# Patient Record
Sex: Female | Born: 1950 | Race: White | Hispanic: No | Marital: Married | Smoking: Never smoker
Health system: Southern US, Community
[De-identification: ages and names within clinical notes are randomized; demographics above are authoritative.]

## PROBLEM LIST (undated history)

## (undated) DIAGNOSIS — Z87442 Personal history of urinary calculi: Secondary | ICD-10-CM

## (undated) DIAGNOSIS — N200 Calculus of kidney: Secondary | ICD-10-CM

## (undated) DIAGNOSIS — K219 Gastro-esophageal reflux disease without esophagitis: Secondary | ICD-10-CM

## (undated) DIAGNOSIS — R7303 Prediabetes: Secondary | ICD-10-CM

## (undated) DIAGNOSIS — R0982 Postnasal drip: Secondary | ICD-10-CM

## (undated) DIAGNOSIS — K449 Diaphragmatic hernia without obstruction or gangrene: Secondary | ICD-10-CM

## (undated) DIAGNOSIS — N201 Calculus of ureter: Secondary | ICD-10-CM

## (undated) DIAGNOSIS — D649 Anemia, unspecified: Secondary | ICD-10-CM

## (undated) DIAGNOSIS — I10 Essential (primary) hypertension: Secondary | ICD-10-CM

## (undated) DIAGNOSIS — Q625 Duplication of ureter: Secondary | ICD-10-CM

## (undated) DIAGNOSIS — J302 Other seasonal allergic rhinitis: Secondary | ICD-10-CM

## (undated) HISTORY — PX: OTHER SURGICAL HISTORY: SHX169

## (undated) HISTORY — PX: ABDOMINAL HYSTERECTOMY: SHX81

---

## 1983-09-26 HISTORY — PX: THYROID LOBECTOMY: SHX420

## 1999-06-22 ENCOUNTER — Other Ambulatory Visit: Admission: RE | Admit: 1999-06-22 | Discharge: 1999-06-22 | Payer: Self-pay | Admitting: *Deleted

## 1999-06-29 ENCOUNTER — Other Ambulatory Visit: Admission: RE | Admit: 1999-06-29 | Discharge: 1999-06-29 | Payer: Self-pay | Admitting: *Deleted

## 2000-06-12 ENCOUNTER — Other Ambulatory Visit: Admission: RE | Admit: 2000-06-12 | Discharge: 2000-06-12 | Payer: Self-pay | Admitting: *Deleted

## 2001-07-16 ENCOUNTER — Other Ambulatory Visit: Admission: RE | Admit: 2001-07-16 | Discharge: 2001-07-16 | Payer: Self-pay | Admitting: *Deleted

## 2001-07-21 ENCOUNTER — Encounter (HOSPITAL_COMMUNITY): Admission: RE | Admit: 2001-07-21 | Discharge: 2001-07-29 | Payer: Self-pay | Admitting: Family Medicine

## 2003-05-05 ENCOUNTER — Encounter: Admission: RE | Admit: 2003-05-05 | Discharge: 2003-05-05 | Payer: Self-pay | Admitting: Family Medicine

## 2003-05-05 ENCOUNTER — Encounter: Payer: Self-pay | Admitting: Family Medicine

## 2005-10-24 ENCOUNTER — Other Ambulatory Visit: Admission: RE | Admit: 2005-10-24 | Discharge: 2005-10-24 | Payer: Self-pay | Admitting: Family Medicine

## 2005-11-18 ENCOUNTER — Ambulatory Visit (HOSPITAL_COMMUNITY): Admission: RE | Admit: 2005-11-18 | Discharge: 2005-11-18 | Payer: Self-pay | Admitting: Obstetrics & Gynecology

## 2005-12-24 ENCOUNTER — Inpatient Hospital Stay (HOSPITAL_COMMUNITY): Admission: RE | Admit: 2005-12-24 | Discharge: 2005-12-26 | Payer: Self-pay | Admitting: Obstetrics & Gynecology

## 2009-09-30 HISTORY — PX: ABDOMINAL HYSTERECTOMY: SHX81

## 2014-06-15 ENCOUNTER — Other Ambulatory Visit: Payer: Self-pay | Admitting: Obstetrics & Gynecology

## 2014-06-15 DIAGNOSIS — R928 Other abnormal and inconclusive findings on diagnostic imaging of breast: Secondary | ICD-10-CM

## 2014-06-23 ENCOUNTER — Encounter (INDEPENDENT_AMBULATORY_CARE_PROVIDER_SITE_OTHER): Payer: Self-pay

## 2014-06-23 ENCOUNTER — Ambulatory Visit
Admission: RE | Admit: 2014-06-23 | Discharge: 2014-06-23 | Disposition: A | Discharge status: Home / Self Care | Payer: BC Managed Care – PPO | Source: Ambulatory Visit | Attending: Obstetrics & Gynecology | Admitting: Obstetrics & Gynecology

## 2014-06-23 DIAGNOSIS — R928 Other abnormal and inconclusive findings on diagnostic imaging of breast: Secondary | ICD-10-CM

## 2017-07-31 ENCOUNTER — Other Ambulatory Visit: Payer: Self-pay | Admitting: Obstetrics & Gynecology

## 2017-07-31 DIAGNOSIS — R928 Other abnormal and inconclusive findings on diagnostic imaging of breast: Secondary | ICD-10-CM

## 2017-08-07 ENCOUNTER — Ambulatory Visit
Admission: RE | Admit: 2017-08-07 | Discharge: 2017-08-07 | Disposition: A | Discharge status: Home / Self Care | Payer: BC Managed Care – PPO | Source: Ambulatory Visit | Attending: Obstetrics & Gynecology | Admitting: Obstetrics & Gynecology

## 2017-08-07 DIAGNOSIS — R928 Other abnormal and inconclusive findings on diagnostic imaging of breast: Secondary | ICD-10-CM

## 2017-08-07 IMAGING — MG 2D DIGITAL DIAGNOSTIC UNILATERAL LEFT MAMMOGRAM WITH CAD AND ADJ
3 series · 3 of 7 positions shown · non-contrast
Comparison: Previous exam(s).

CLINICAL DATA: Screening recall for possible left breast mass.

EXAM:
2D DIGITAL DIAGNOSTIC UNILATERAL LEFT MAMMOGRAM WITH CAD AND ADJUNCT
TOMO
LEFT BREAST ULTRASOUND

[L MLO]
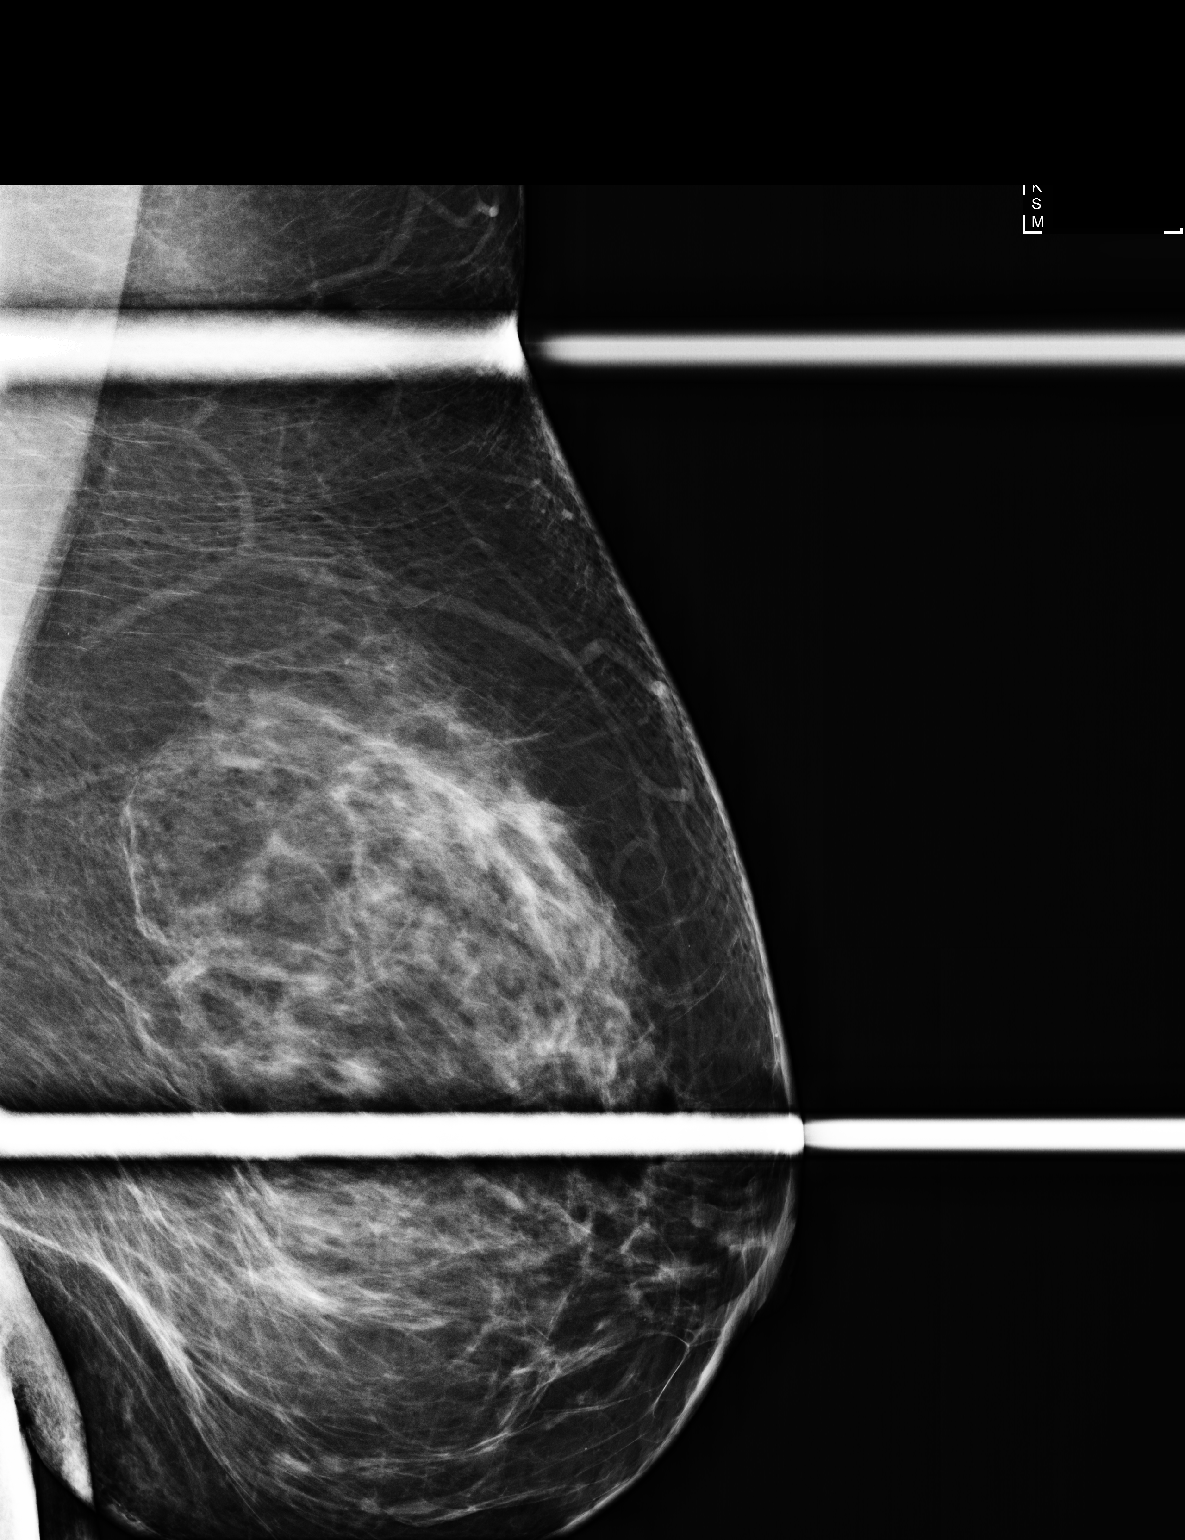

[L CC]
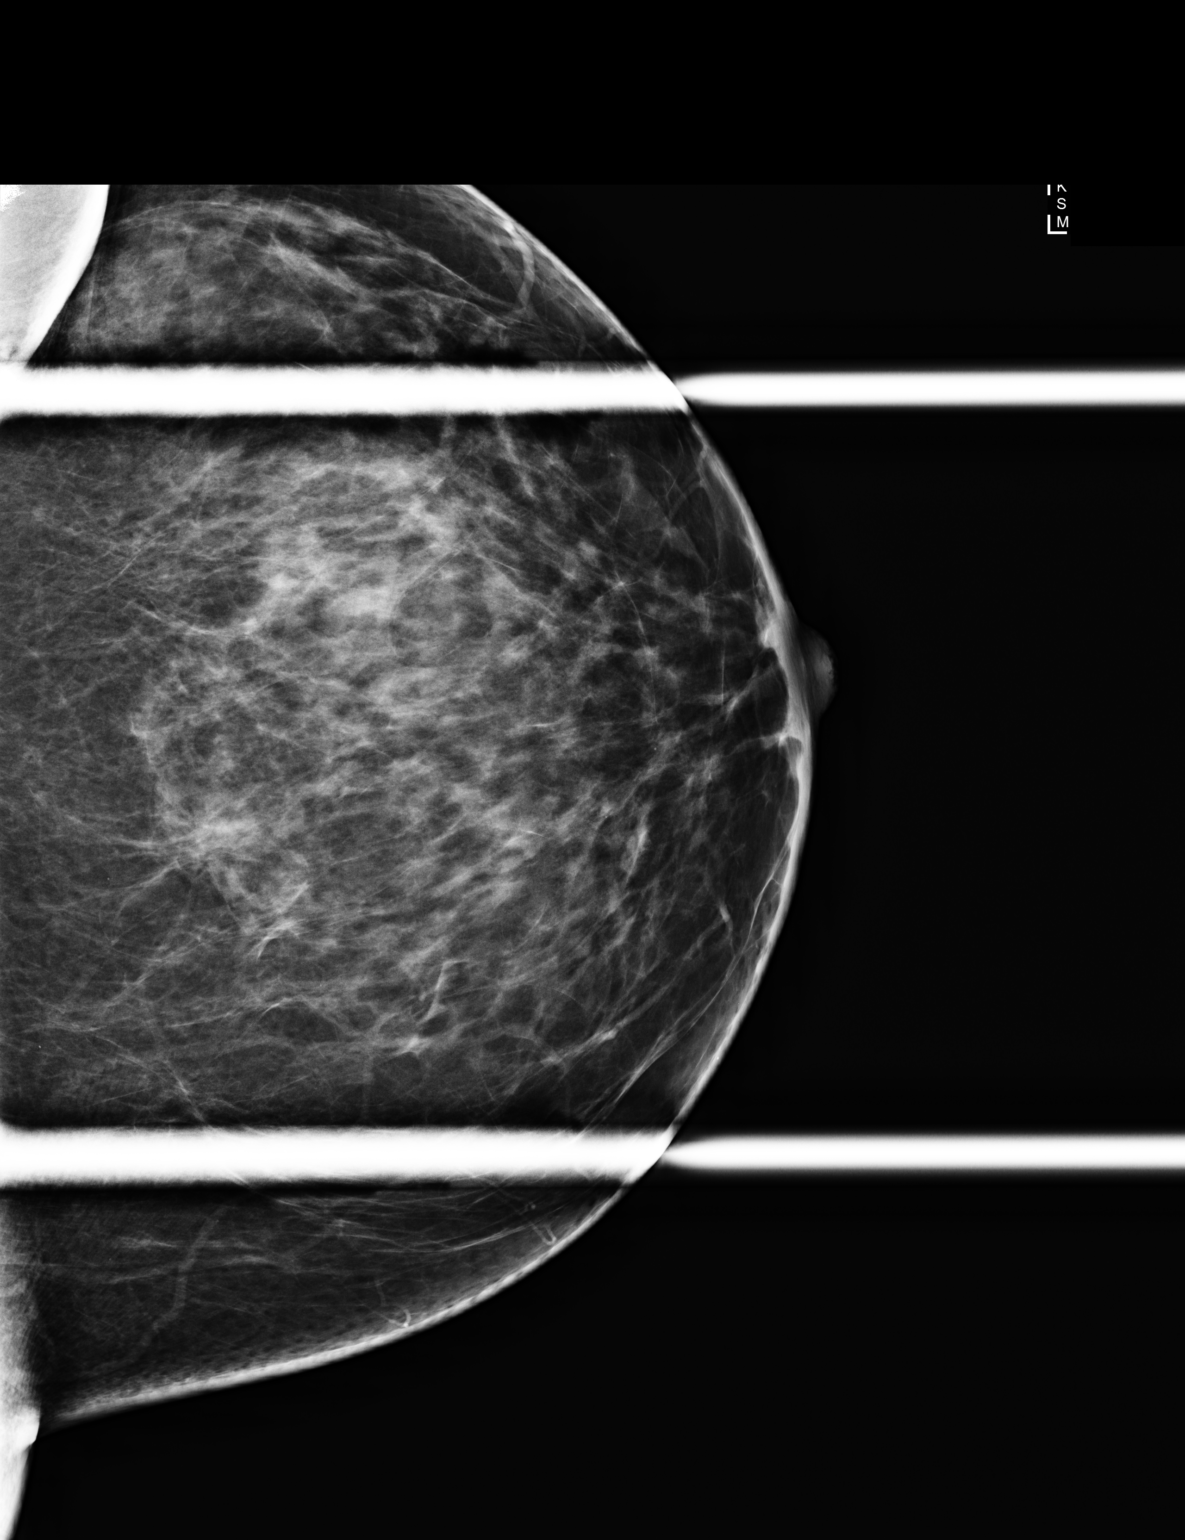

[L CC tomo · tomo slice 40/79.0]
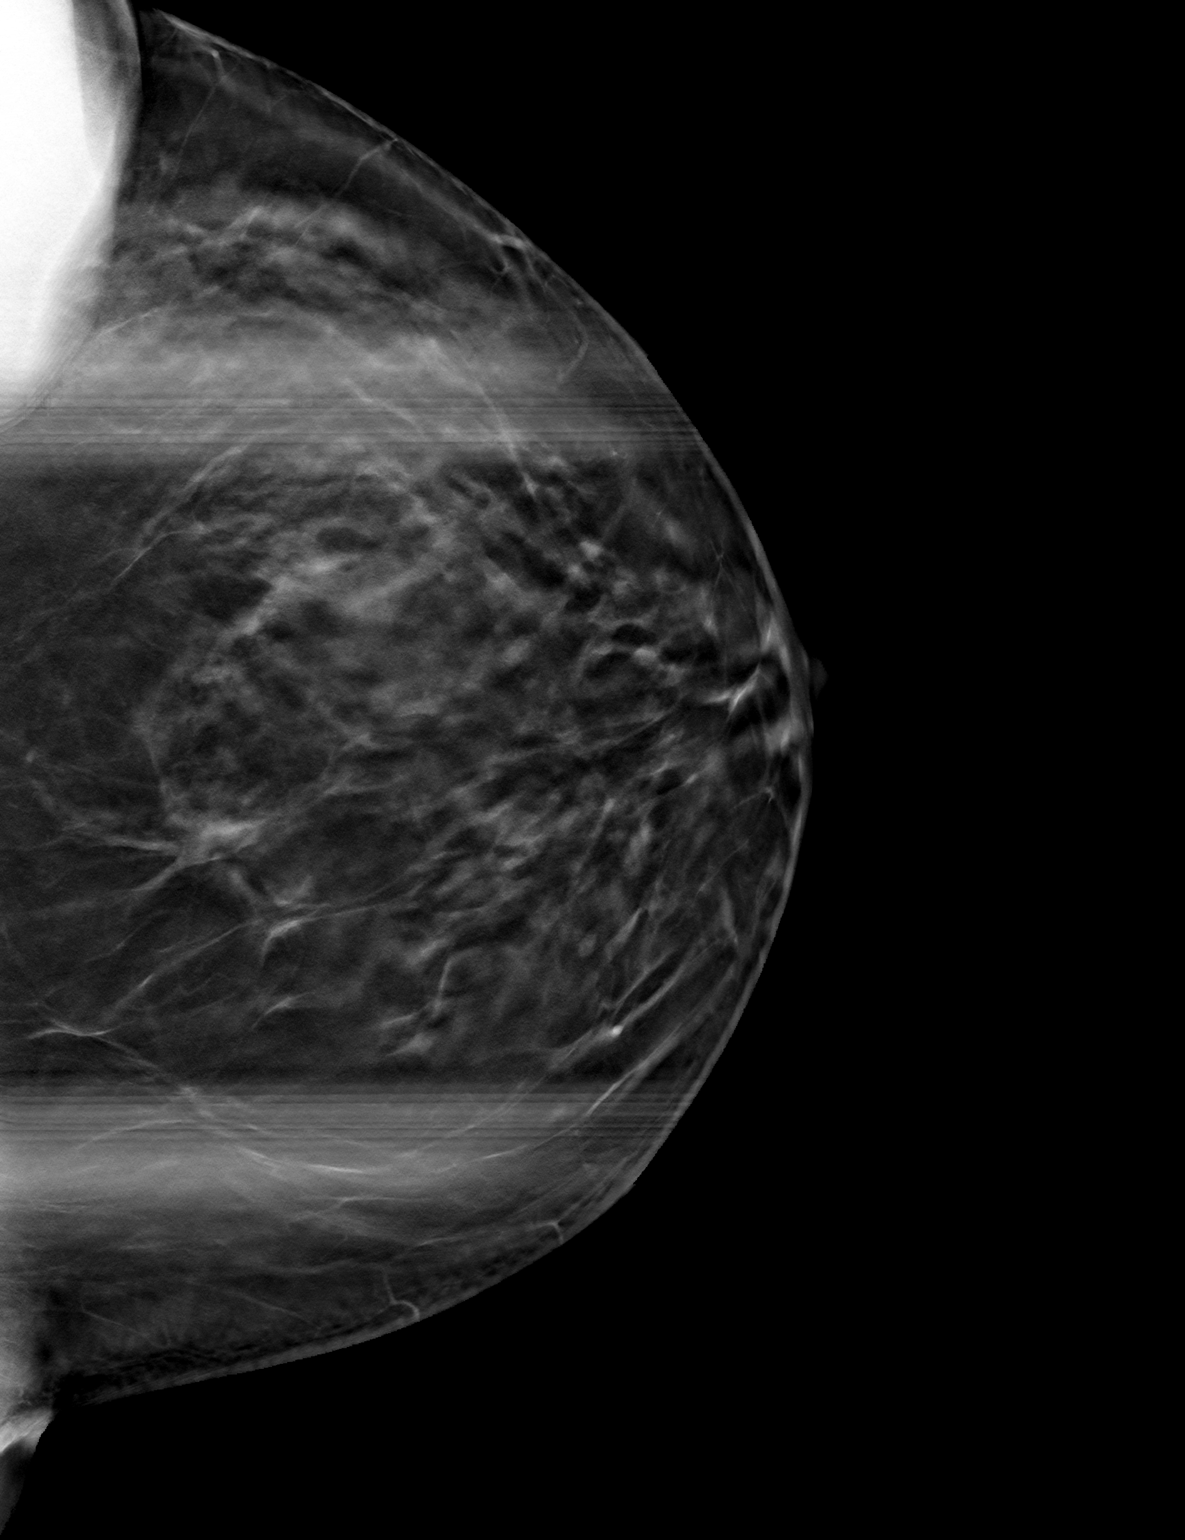

[3 of 7 positions shown; findings below may reference images not displayed]

ACR Breast Density Category c: The breast tissue is heterogeneously
dense, which may obscure small masses.
FINDINGS: Spot compression CC and MLO tomograms were performed of the left
breast. The initially questioned possible left breast mass appears
to resolve on the additional imaging with only heterogeneous
fibroglandular tissue seen in this location.

Mammographic images were processed with CAD.

Targeted ultrasound of the central to inner left breast was
performed. No discrete masses or abnormalities identified, only
heterogeneous fibroglandular tissue seen.
IMPRESSION: No findings of malignancy in the left breast.

RECOMMENDATION:
Recommend annual routine screening mammography, due [DATE].

I have discussed the findings and recommendations with the patient.
Results were also provided in writing at the conclusion of the
visit. If applicable, a reminder letter will be sent to the patient
regarding the next appointment.

BI-RADS CATEGORY  1: Negative.

## 2018-12-11 ENCOUNTER — Other Ambulatory Visit: Payer: Self-pay

## 2018-12-11 ENCOUNTER — Encounter (HOSPITAL_COMMUNITY): Payer: Self-pay | Admitting: *Deleted

## 2018-12-11 ENCOUNTER — Emergency Department (HOSPITAL_COMMUNITY): Payer: Medicare Other

## 2018-12-11 ENCOUNTER — Emergency Department (HOSPITAL_COMMUNITY)
Admission: EM | Admit: 2018-12-11 | Discharge: 2018-12-11 | Disposition: A | Discharge status: Home / Self Care | Payer: Medicare Other | Attending: Emergency Medicine | Admitting: Emergency Medicine

## 2018-12-11 DIAGNOSIS — N201 Calculus of ureter: Secondary | ICD-10-CM | POA: Diagnosis not present

## 2018-12-11 DIAGNOSIS — R1032 Left lower quadrant pain: Secondary | ICD-10-CM

## 2018-12-11 DIAGNOSIS — I10 Essential (primary) hypertension: Secondary | ICD-10-CM | POA: Insufficient documentation

## 2018-12-11 DIAGNOSIS — R109 Unspecified abdominal pain: Secondary | ICD-10-CM

## 2018-12-11 DIAGNOSIS — R10A2 Flank pain, left side: Secondary | ICD-10-CM

## 2018-12-11 DIAGNOSIS — R112 Nausea with vomiting, unspecified: Secondary | ICD-10-CM

## 2018-12-11 HISTORY — DX: Essential (primary) hypertension: I10

## 2018-12-11 HISTORY — DX: Calculus of kidney: N20.0

## 2018-12-11 LAB — CBC WITH DIFFERENTIAL/PLATELET
Abs Immature Granulocytes: 0.04 10*3/uL (ref 0.00–0.07)
BASOS ABS: 0 10*3/uL (ref 0.0–0.1)
Basophils Relative: 0 %
EOS PCT: 1 %
Eosinophils Absolute: 0.1 10*3/uL (ref 0.0–0.5)
HCT: 44.9 % (ref 36.0–46.0)
Hemoglobin: 15.1 g/dL — ABNORMAL HIGH (ref 12.0–15.0)
IMMATURE GRANULOCYTES: 0 %
LYMPHS PCT: 16 %
Lymphs Abs: 1.9 10*3/uL (ref 0.7–4.0)
MCH: 29.9 pg (ref 26.0–34.0)
MCHC: 33.6 g/dL (ref 30.0–36.0)
MCV: 88.9 fL (ref 80.0–100.0)
Monocytes Absolute: 0.7 10*3/uL (ref 0.1–1.0)
Monocytes Relative: 6 %
NEUTROS PCT: 77 %
Neutro Abs: 9.2 10*3/uL — ABNORMAL HIGH (ref 1.7–7.7)
Platelets: 319 10*3/uL (ref 150–400)
RBC: 5.05 MIL/uL (ref 3.87–5.11)
RDW: 12.5 % (ref 11.5–15.5)
WBC: 12 10*3/uL — ABNORMAL HIGH (ref 4.0–10.5)
nRBC: 0 % (ref 0.0–0.2)

## 2018-12-11 LAB — BASIC METABOLIC PANEL
ANION GAP: 9 (ref 5–15)
BUN: 17 mg/dL (ref 8–23)
CALCIUM: 11 mg/dL — AB (ref 8.9–10.3)
CO2: 24 mmol/L (ref 22–32)
Chloride: 107 mmol/L (ref 98–111)
Creatinine, Ser: 0.8 mg/dL (ref 0.44–1.00)
GFR calc non Af Amer: >60 mL/min (ref >60)
Glucose, Bld: 118 mg/dL — ABNORMAL HIGH (ref 70–99)
Potassium: 3.4 mmol/L — ABNORMAL LOW (ref 3.5–5.1)
Sodium: 140 mmol/L (ref 135–145)

## 2018-12-11 LAB — URINALYSIS, ROUTINE W REFLEX MICROSCOPIC
BILIRUBIN URINE: NEGATIVE
Glucose, UA: NEGATIVE mg/dL
KETONES UR: 20 mg/dL — AB
NITRITE: NEGATIVE
PH: 5 (ref 5.0–8.0)
Protein, ur: NEGATIVE mg/dL
RBC / HPF: >50 RBC/hpf — ABNORMAL HIGH (ref 0–5)
Specific Gravity, Urine: 1.014 (ref 1.005–1.030)

## 2018-12-11 MED ORDER — ONDANSETRON 4 MG PO TBDP: 4.0000 mg | ORAL_TABLET | Freq: Three times a day (TID) | ORAL | 0 refills | Status: DC | PRN

## 2018-12-11 MED ORDER — KETOROLAC TROMETHAMINE 30 MG/ML IJ SOLN
15.0000 mg | Freq: Once | INTRAMUSCULAR | Status: AC
  Administered 2018-12-11: 15 mg via INTRAVENOUS
  Filled 2018-12-11: qty 1

## 2018-12-11 MED ORDER — HYDROCODONE-ACETAMINOPHEN 5-325 MG PO TABS: 1.0000 | ORAL_TABLET | Freq: Four times a day (QID) | ORAL | 0 refills | Status: DC | PRN

## 2018-12-11 MED ORDER — NAPROXEN 500 MG PO TABS: 500.0000 mg | ORAL_TABLET | Freq: Two times a day (BID) | ORAL | 0 refills | Status: DC | PRN

## 2018-12-11 MED ORDER — TAMSULOSIN HCL 0.4 MG PO CAPS: 0.4000 mg | ORAL_CAPSULE | Freq: Every day | ORAL | 0 refills | Status: DC

## 2018-12-11 MED ORDER — ONDANSETRON HCL 4 MG/2ML IJ SOLN
4.0000 mg | Freq: Once | INTRAMUSCULAR | Status: AC
Start: 2018-12-11 — End: 2018-12-11
  Administered 2018-12-11: 4 mg via INTRAVENOUS
  Filled 2018-12-11: qty 2

## 2018-12-11 NOTE — ED Triage Notes (Signed)
Pt was instructed to come to the ED from [facility] walk-in clinic today.  She reports L flank pain x 2 days ago, started to radiate to her L pelvic area yesterday with dry heaving.  Vomited x 1 today.  She also endorses urinary frequency and some mild discomfort when urinating.  She was found to have hematuria at the clinic today and was told she needed CT to r/o kidney stones.

## 2018-12-11 NOTE — Discharge Instructions (Signed)
Take naprosyn as directed as needed for pain using norco for breakthrough pain. Do not drive or operate machinery with pain medication use. May need over-the-counter stool softener with this pain medication use. Use Zofran as needed for nausea. Use Flomax as directed, as this medication will help you pass the stone. Strain all urine to try to catch the stone when it passes. Follow-up with the urologist in the next 1 to 2 weeks for recheck of ongoing pain, however for intractable or uncontrollable symptoms at home then return to the [facility] emergency department.  °  °

## 2018-12-11 NOTE — ED Provider Notes (Signed)
[facility] DEPT Provider Note   CSN: [id number] Arrival date & time: 12/11/18  1404    History   Chief Complaint Chief Complaint  Patient presents with   Hematuria    HPI    Cynthia Dyer is a 68 y.o. female with a PMHx of HTN and kidney stones, and PSHx of abdominal hysterectomy, who presents to the ED with complaints of possible kidney stone.  Patient states that yesterday she started having left flank pain which was radiating to the LLQ, felt similar to prior kidney stones.  The pain has come and gone, but today got severe so she went to an urgent care who tested her urine sample and said that she had hematuria and they felt that she needed to be evaluated in the emergency room to get a CT to see if she had a kidney stone.  She states that the pain was initially in her left flank radiating to the LLQ but now has become more focalized in the LLQ.  She describes the pain as 8/10 sharp intermittent pain worse with movement and with no treatments tried prior to arrival.  She reports associated increased urinary frequency and urgency, darker urine, nausea, and one episode of nonbloody nonbilious emesis today.  She denies any recent travel, sick contacts, suspicious food intake, frequent NSAID use, or recent alcohol use.  She denies any fevers, chills, chest pain, shortness of breath, hematemesis, diarrhea, constipation, melena, hematochezia, dysuria, malodorous urine, vaginal bleeding or discharge, myalgias, arthralgias, numbness, tingling, focal weakness, or any other complaints at this time.  She states this feels like prior kidney stones.  The history is provided by the patient and medical records. No language interpreter was used.  Hematuria  Associated symptoms include abdominal pain. Pertinent negatives include no chest pain and no shortness of breath.    Past Medical History:  Diagnosis Date   Hypertension    Kidney stone     There are no active  problems to display for this patient.   Past Surgical History:  Procedure Laterality Date   ABDOMINAL HYSTERECTOMY       OB History   No obstetric history on file.      Home Medications    Prior to Admission medications   Not on File    Family History No family history on file.  Social History Social History   Tobacco Use   Smoking status: Never Smoker   Smokeless tobacco: Never Used  Substance Use Topics   Alcohol use: Yes    Comment: occa   Drug use: Never     Allergies   Penicillins and Percocet [oxycodone-acetaminophen]   Review of Systems Review of Systems  Constitutional: Negative for chills and fever.  Respiratory: Negative for shortness of breath.   Cardiovascular: Negative for chest pain.  Gastrointestinal: Positive for abdominal pain, nausea and vomiting. Negative for blood in stool, constipation and diarrhea.  Genitourinary: Positive for flank pain, frequency, hematuria (darker urine, told at [facility] that +hematuria) and urgency. Negative for dysuria, vaginal bleeding and vaginal discharge.       No malodorous urine  Musculoskeletal: Negative for arthralgias and myalgias.  Skin: Negative for color change.  Allergic/Immunologic: Negative for immunocompromised state.  Neurological: Negative for weakness and numbness.  Psychiatric/Behavioral: Negative for confusion.   All other systems reviewed and are negative for acute change except as noted in the HPI.    Physical Exam Updated Vital Signs BP (!) 174/98 (BP Location: Left Arm)  Pulse 78    Temp 98.5 F (36.9 C) (Oral)    Resp 16    SpO2 100%   Physical Exam Vitals signs and nursing note reviewed.  Constitutional:      General: She is not in acute distress.    Appearance: Normal appearance. She is well-developed. She is not toxic-appearing.     Comments: Afebrile, nontoxic, NAD  HENT:     Head: Normocephalic and atraumatic.  Eyes:     General:        Right eye: No discharge.         Left eye: No discharge.     Conjunctiva/sclera: Conjunctivae normal.  Neck:     Musculoskeletal: Normal range of motion and neck supple.  Cardiovascular:     Rate and Rhythm: Normal rate and regular rhythm.     Pulses: Normal pulses.     Heart sounds: Normal heart sounds, S1 normal and S2 normal. No murmur. No friction rub. No gallop.   Pulmonary:     Effort: Pulmonary effort is normal. No respiratory distress.     Breath sounds: Normal breath sounds. No decreased breath sounds, wheezing, rhonchi or rales.  Abdominal:     General: Bowel sounds are normal. There is no distension.     Palpations: Abdomen is soft. Abdomen is not rigid.     Tenderness: There is abdominal tenderness in the left lower quadrant. There is no right CVA tenderness, left CVA tenderness, guarding or rebound. Negative signs include Murphy's sign and McBurney's sign.       Comments: Soft, nondistended, +BS throughout, with mild LLQ TTP, no r/g/r, neg murphy's, neg mcburney's, no CVA TTP   Musculoskeletal: Normal range of motion.  Skin:    General: Skin is warm and dry.     Findings: No rash.  Neurological:     Mental Status: She is alert and oriented to person, place, and time.     Sensory: Sensation is intact. No sensory deficit.     Motor: Motor function is intact.  Psychiatric:        Mood and Affect: Mood and affect normal.        Behavior: Behavior normal.      ED Treatments / Results  Labs (all labs ordered are listed, but only abnormal results are displayed) Labs Reviewed  URINALYSIS, ROUTINE W REFLEX MICROSCOPIC - Abnormal; Notable for the following components:      Result Value   APPearance CLOUDY (*)    Hgb urine dipstick LARGE (*)    Ketones, ur 20 (*)    Leukocytes,Ua MODERATE (*)    RBC / HPF >50 (*)    Bacteria, UA RARE (*)    All other components within normal limits  CBC WITH DIFFERENTIAL/PLATELET - Abnormal; Notable for the following components:   WBC 12.0 (*)    Hemoglobin 15.1 (*)     Neutro Abs 9.2 (*)    All other components within normal limits  BASIC METABOLIC PANEL - Abnormal; Notable for the following components:   Potassium 3.4 (*)    Glucose, Bld 118 (*)    Calcium 11.0 (*)    All other components within normal limits    EKG None  Radiology Ct Renal Stone Study  Result Date: 12/11/2018 CLINICAL DATA:  68 year old female with left flank pain, microscopic hematuria. Symptoms beginning 2000 hours yesterday with nausea vomiting. EXAM: CT ABDOMEN AND PELVIS WITHOUT CONTRAST TECHNIQUE: Multidetector CT imaging of the abdomen and pelvis was performed following the  standard protocol without IV contrast. COMPARISON:  None. FINDINGS: Lower chest: Negative aside from small gastric hiatal hernia. Hepatobiliary: Negative noncontrast liver and gallbladder. Pancreas: Negative. Spleen: Negative. Adrenals/Urinary Tract: Normal adrenal glands. Negative noncontrast right kidney and right ureter. There is only mild left hydronephrosis, but there is up to moderate left hydroureter with periureteral stranding continuing to the left ureterovesical junction. 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 At the left UVJ there is a 5 by 10 millimeter obstructing calculus (series 2, image 62 and coronal image 94. The urinary bladder is otherwise diminutive and unremarkable. No other urinary calculus identified. Stomach/Bowel: Moderate diverticulosis from the hepatic flexure through the sigmoid colon. No definite active inflammation. Negative right colon and appendix. Negative terminal ileum. No dilated small bowel. Negative stomach aside from small hiatal hernia. No free air, free fluid. Vascular/Lymphatic: Vascular patency is not evaluated in the absence of IV contrast. No lymphadenopathy. Reproductive: Surgically absent uterus. Diminutive or absent ovaries. Other: No pelvic free fluid. Musculoskeletal: Severe lower lumbar facet arthropathy.  Bilateral vacuum SI joint phenomena is degenerative. No acute osseous abnormality identified. IMPRESSION: 1. Acute obstructive uropathy on the left with a 5 x 10 mm calculus at the left UVJ. 2. 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 No other urologic calculus identified. 3. Diverticulosis of the colon. Small gastric hiatal hernia. Electronically Signed   By: Genevie Ann M.D.   On: 12/11/2018 18:23    Procedures Procedures (including critical care time)  Medications Ordered in ED Medications  ondansetron (ZOFRAN) injection 4 mg (4 mg Intravenous Given 12/11/18 1821)  ketorolac (TORADOL) 30 MG/ML injection 15 mg (15 mg Intravenous Given 12/11/18 1822)     Initial Impression / Assessment and Plan / ED Course  I have reviewed the triage vital signs and the nursing notes.  Pertinent labs & imaging results that were available during my care of the patient were reviewed by me and considered in my medical decision making (see chart for details).        68 y.o. female here with left flank pain that began yesterday and has now radiated to the LLQ, feels like prior kidney stones.  Has had some nausea and vomiting as well as darker urine and urinary frequency and urgency.  Was seen at an urgent care who told her that she had a lot of blood in her urine and they wanted her to have a CT scan done to evaluate for kidney stone so they sent her here.  On exam, no left flank tenderness, mild LLQ TTP, non-peritoneal.  Afebrile and nontoxic-appearing.  Urinalysis with large hemoglobin, moderate leuks but no nitrates, greater than 50 RBCs, 21-50 WBCs but rare bacteria.  Likely kidney stone.  Will obtain basic lab work to check kidney function and get CT renal study.  Will give Toradol and Zofran and reassess shortly. Discussed case with my attending Dr. Ronnald Nian who agrees with plan.   7:41 PM CBC w/diff with mildly elevated WBC 12.0 but otherwise unremarkable. BMP fairly  unremarkable. CT renal showing 5x58mm calculus at the L UVJ and incidentally noted duplicated left renal collecting system and proximal ureter but no other urologic calculus identified, also incidentally noted diverticulosis and small gastric hiatal hernia. Pt feeling better. Doubt need for further emergent work up, doubt need for emergent consultation with urology as this kidney stone should pass given that it's 2mm in width on imaging. Will send home with zofran, naprosyn, norco,  and flomax. Urine strainer given. Advised staying hydrated. F/up with urology in 1-2wks but strict return precautions advised. I explained the diagnosis and have given explicit precautions to return to the ER including for any other new or worsening symptoms. The patient understands and accepts the medical plan as it's been dictated and I have answered their questions. Discharge instructions concerning home care and prescriptions have been given. The patient is STABLE and is discharged to home in good condition.    Final Clinical Impressions(s) / ED Diagnoses   Final diagnoses:  Left flank pain  Ureterolithiasis  Nausea and vomiting in adult patient  Left lower quadrant abdominal pain    ED Discharge Orders         Ordered    naproxen (NAPROSYN) 500 MG tablet  2 times daily PRN     12/11/18 1941    HYDROcodone-acetaminophen (NORCO) 5-325 MG tablet  Every 6 hours PRN     12/11/18 1941    tamsulosin (FLOMAX) 0.4 MG CAPS capsule  Daily after supper     12/11/18 1941    ondansetron (ZOFRAN ODT) 4 MG disintegrating tablet  Every 8 hours PRN     12/11/18 [address] [place], PA-C 12/11/18 [place], [place], DO 12/12/18 [phone]

## 2018-12-17 ENCOUNTER — Other Ambulatory Visit: Payer: Self-pay | Admitting: Urology

## 2018-12-21 ENCOUNTER — Encounter (HOSPITAL_COMMUNITY): Payer: Self-pay | Admitting: *Deleted

## 2018-12-24 ENCOUNTER — Ambulatory Visit (HOSPITAL_COMMUNITY)
Admission: RE | Admit: 2018-12-24 | Discharge: 2018-12-24 | Disposition: A | Discharge status: Home / Self Care | Payer: Medicare Other | Attending: Urology | Admitting: Urology

## 2018-12-24 ENCOUNTER — Encounter (HOSPITAL_COMMUNITY): Payer: Self-pay | Admitting: General Practice

## 2018-12-24 ENCOUNTER — Ambulatory Visit (HOSPITAL_COMMUNITY): Payer: Medicare Other

## 2018-12-24 ENCOUNTER — Encounter (HOSPITAL_COMMUNITY)
Admission: RE | Disposition: A | Discharge status: Home / Self Care | Payer: Self-pay | Source: Home / Self Care | Attending: Urology

## 2018-12-24 DIAGNOSIS — N201 Calculus of ureter: Secondary | ICD-10-CM | POA: Diagnosis present

## 2018-12-24 DIAGNOSIS — Z885 Allergy status to narcotic agent status: Secondary | ICD-10-CM | POA: Insufficient documentation

## 2018-12-24 DIAGNOSIS — Z87442 Personal history of urinary calculi: Secondary | ICD-10-CM | POA: Insufficient documentation

## 2018-12-24 DIAGNOSIS — Z880 Allergy status to penicillin: Secondary | ICD-10-CM | POA: Insufficient documentation

## 2018-12-24 DIAGNOSIS — I10 Essential (primary) hypertension: Secondary | ICD-10-CM | POA: Insufficient documentation

## 2018-12-24 DIAGNOSIS — N139 Obstructive and reflux uropathy, unspecified: Secondary | ICD-10-CM | POA: Diagnosis not present

## 2018-12-24 HISTORY — DX: Personal history of urinary calculi: Z87.442

## 2018-12-24 HISTORY — PX: EXTRACORPOREAL SHOCK WAVE LITHOTRIPSY: SHX1557

## 2018-12-24 SURGERY — LITHOTRIPSY, ESWL
Anesthesia: LOCAL | Laterality: Left

## 2018-12-24 MED ORDER — DIPHENHYDRAMINE HCL 25 MG PO CAPS
25.0000 mg | ORAL_CAPSULE | ORAL | Status: AC
  Administered 2018-12-24: 25 mg via ORAL
  Filled 2018-12-24: qty 1

## 2018-12-24 MED ORDER — DIAZEPAM 5 MG PO TABS
10.0000 mg | ORAL_TABLET | ORAL | Status: AC
  Administered 2018-12-24: 10 mg via ORAL
  Filled 2018-12-24: qty 2

## 2018-12-24 MED ORDER — SODIUM CHLORIDE 0.9 % IV SOLN
INTRAVENOUS | Status: DC
  Administered 2018-12-24: 07:00:00 via INTRAVENOUS

## 2018-12-24 MED ORDER — TAMSULOSIN HCL 0.4 MG PO CAPS: 0.4000 mg | ORAL_CAPSULE | Freq: Every day | ORAL | 1 refills | Status: DC

## 2018-12-24 MED ORDER — CIPROFLOXACIN HCL 500 MG PO TABS
500.0000 mg | ORAL_TABLET | ORAL | Status: AC
  Administered 2018-12-24: 500 mg via ORAL
  Filled 2018-12-24: qty 1

## 2018-12-24 NOTE — H&P (Signed)
CC: flank pain   HPI: Cynthia Dyer is a 68 year old female with a one week history of left LLQ pain secondary to a 5 x 10 mm left UVJ stone.   Hx of stones--passed one >25 years ago.   Today, she reports intermittent left sided flank and LLQ pain that she describes as dull in nature. She is currently on naproxen, flomax which partially alleviates her pain. Denies N/V/F/C. AFVSS today.   CTSS 12/11/2018  IMPRESSION:  1. Acute obstructive uropathy on the left with a 5 x 10 mm calculus  at the left UVJ.  2. 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 No other urologic calculus identified.  3. Diverticulosis of the colon. Small gastric hiatal hernia.     ALLERGIES: Fiorinal CAPS penicillin Percocet    MEDICATIONS: Bisoprolol-Hydrochlorothiazide  Flonase Allergy Relief     GU PSH: Hysterectomy - 2011    NON-GU PSH: Thyroid Surgery - 1984    GU PMH: None   NON-GU PMH: Gout Hypertension Hypothyroidism    FAMILY HISTORY: Death In The Family Father - Father Enlarged prostate - Father Kidney Stones - Father   SOCIAL HISTORY: Marital Status: Married Preferred Language: English; Ethnicity: Not Hispanic Or Latino; Race: White Current Smoking Status: Patient has never smoked.   Tobacco Use Assessment Completed: Used Tobacco in last 30 days? Social Drinker.  Drinks 2 caffeinated drinks per day.    REVIEW OF SYSTEMS:    GU Review Female:   Patient reports frequent urination, hard to postpone urination, burning /pain with urination, and get up at night to urinate. Patient denies leakage of urine, stream starts and stops, trouble starting your stream, have to strain to urinate, and being pregnant.  Gastrointestinal (Upper):   Patient reports nausea. Patient denies vomiting and indigestion/ heartburn.  Gastrointestinal (Lower):   Patient denies constipation and diarrhea.  Constitutional:   Patient reports weight loss. Patient denies fever, night  sweats, and fatigue.  Skin:   Patient denies skin rash/ lesion and itching.  Eyes:   Patient denies blurred vision and double vision.  Ears/ Nose/ Throat:   Patient reports sinus problems. Patient denies sore throat.  Hematologic/Lymphatic:   Patient denies swollen glands and easy bruising.  Cardiovascular:   Patient denies leg swelling and chest pains.  Respiratory:   Patient denies cough and shortness of breath.  Endocrine:   Patient denies excessive thirst.  Musculoskeletal:   Patient denies back pain and joint pain.  Neurological:   Patient reports headaches. Patient denies dizziness.  Psychologic:   Patient denies depression and anxiety.   VITAL SIGNS:      12/17/2018 09:38 AM  Weight 173 lb / 78.47 kg  Height 62 in / 157.48 cm  BP 156/91 mmHg  Pulse 71 /min  Temperature 98.3 F / 36.8 C  BMI 31.6 kg/m   MULTI-SYSTEM PHYSICAL EXAMINATION:    Constitutional: Well-nourished. No physical deformities. Normally developed. Good grooming.  Neck: Neck symmetrical, not swollen. Normal tracheal position.  Respiratory: No labored breathing, no use of accessory muscles.   Cardiovascular: Normal temperature, normal extremity pulses, no swelling, no varicosities.  Lymphatic: No enlargement of neck, axillae, groin.  Skin: No paleness, no jaundice, no cyanosis. No lesion, no ulcer, no rash.  Neurologic / Psychiatric: Oriented to time, oriented to place, oriented to person. No depression, no anxiety, no agitation.  Gastrointestinal: No mass, no tenderness, no rigidity, non obese abdomen.  Eyes: Normal conjunctivae. Normal eyelids.  Ears, Nose, Mouth, and  Throat: Left ear no scars, no lesions, no masses. Right ear no scars, no lesions, no masses. Nose no scars, no lesions, no masses. Normal hearing. Normal lips.  Musculoskeletal: Normal gait and station of head and neck.     PAST DATA REVIEWED:  Source Of History:  Patient   PROCEDURES:         KUB - [id number]  A single view of the abdomen is  obtained.      Patient confirmed No Neulasta OnPro Device.   There is a persistent calcification in the expect course of the distal left ureter. No urinary calculi seen within the upper urinary tract on the right. No calcifications noted within bladder. No boney abnormalities. No abnormal bowel/gas patterns or signs of free air.          Urinalysis w/Scope Dipstick Dipstick Cont'd Micro  Color: Straw Bilirubin: Neg mg/dL WBC/hpf: 6 - 10/hpf  Appearance: Cloudy Ketones: Neg mg/dL RBC/hpf: 0 - 2/hpf  Specific Gravity: 1.010 Blood: 2+ ery/uL Bacteria: NS (Not Seen)  pH: <=5.0 Protein: Neg mg/dL Cystals: NS (Not Seen)  Glucose: Neg mg/dL Urobilinogen: 0.2 mg/dL Casts: NS (Not Seen)    Nitrites: Neg Trichomonas: Not Present    Leukocyte Esterase: 3+ leu/uL Mucous: Not Present      Epithelial Cells: 0 - 5/hpf      Yeast: NS (Not Seen)      Sperm: Not Present    ASSESSMENT:      ICD-10 Details  1 GU:   Ureteral calculus - N20.1 Left, 5x 10 mm left UVJ stone  2   History of urolithiasis - Z87.442    PLAN:           Orders X-Rays: KUB          Schedule Return Visit/Planned Activity: ASAP - Schedule Surgery          Document Letter(s):  Created for Patient: Clinical Summary         Notes:   -The risks, benefits and alternatives of LEFT ESWL was discussed with the patient. I described the risks which include arrhythmia, kidney contusion, kidney hemorrhage, need for transfusion, back discomfort, flank ecchymosis, flank abrasion, inability to break up stone, inability to pass stone fragments, Steinstrasse, infection associated with obstructing stones, need for different surgical procedure and possible need for repeat shockwave lithotripsy. The patient voices understanding and wishes to proceed.   We discussed criteria to return to clinic or proceed to the ER which include: Fever/chills, worsening pain, nausea/vomiting and/or persistent gross hematuria.

## 2018-12-24 NOTE — Op Note (Signed)
[facility] Operative Note  Treating Physician: Ellison Hughs, MD  Pre-op diagnosis: 1.0 cm left UVJ stone  Post-op diagnosis: Same   Procedure: LEFT ESWL  See Aris Everts OP note scanned into chart. Also because of the size, density, location and other factors that cannot be anticipated I feel this will likely be a staged procedure. This fact supersedes any indication in the scanned [place] stone operative note to the contrary

## 2018-12-25 ENCOUNTER — Encounter (HOSPITAL_COMMUNITY): Payer: Self-pay | Admitting: Urology

## 2019-04-16 ENCOUNTER — Other Ambulatory Visit: Payer: Self-pay | Admitting: Urology

## 2019-04-20 ENCOUNTER — Other Ambulatory Visit (HOSPITAL_COMMUNITY)
Admission: RE | Admit: 2019-04-20 | Discharge: 2019-04-20 | Disposition: A | Discharge status: Home / Self Care | Payer: Medicare Other | Source: Ambulatory Visit | Attending: Urology | Admitting: Urology

## 2019-04-20 DIAGNOSIS — Z1159 Encounter for screening for other viral diseases: Secondary | ICD-10-CM | POA: Insufficient documentation

## 2019-04-20 LAB — SARS CORONAVIRUS 2 (TAT 6-24 HRS): SARS Coronavirus 2: NEGATIVE

## 2019-04-21 ENCOUNTER — Encounter (HOSPITAL_BASED_OUTPATIENT_CLINIC_OR_DEPARTMENT_OTHER): Payer: Self-pay | Admitting: *Deleted

## 2019-04-21 ENCOUNTER — Other Ambulatory Visit: Payer: Self-pay

## 2019-04-21 NOTE — Progress Notes (Signed)
Spoke w/ pt via phone for pre-op interview.  Npo after mn w/ exception clear liquids until 0730 then nothing by mouth, pt verbalized understanding.  Arrive at 1145.  Needs istat 8 and ekg.  Pt had covid test done 04-20-2019.

## 2019-04-23 ENCOUNTER — Ambulatory Visit (HOSPITAL_BASED_OUTPATIENT_CLINIC_OR_DEPARTMENT_OTHER): Payer: Medicare Other | Admitting: Certified Registered"

## 2019-04-23 ENCOUNTER — Other Ambulatory Visit: Payer: Self-pay

## 2019-04-23 ENCOUNTER — Encounter (HOSPITAL_BASED_OUTPATIENT_CLINIC_OR_DEPARTMENT_OTHER)
Admission: RE | Disposition: A | Discharge status: Home / Self Care | Payer: Self-pay | Source: Home / Self Care | Attending: Urology

## 2019-04-23 ENCOUNTER — Ambulatory Visit (HOSPITAL_BASED_OUTPATIENT_CLINIC_OR_DEPARTMENT_OTHER)
Admission: RE | Admit: 2019-04-23 | Discharge: 2019-04-23 | Disposition: A | Discharge status: Home / Self Care | Payer: Medicare Other | Attending: Urology | Admitting: Urology

## 2019-04-23 ENCOUNTER — Encounter (HOSPITAL_BASED_OUTPATIENT_CLINIC_OR_DEPARTMENT_OTHER): Payer: Self-pay | Admitting: *Deleted

## 2019-04-23 DIAGNOSIS — I10 Essential (primary) hypertension: Secondary | ICD-10-CM | POA: Diagnosis not present

## 2019-04-23 DIAGNOSIS — E890 Postprocedural hypothyroidism: Secondary | ICD-10-CM | POA: Diagnosis not present

## 2019-04-23 DIAGNOSIS — Q625 Duplication of ureter: Secondary | ICD-10-CM | POA: Diagnosis not present

## 2019-04-23 DIAGNOSIS — K449 Diaphragmatic hernia without obstruction or gangrene: Secondary | ICD-10-CM | POA: Insufficient documentation

## 2019-04-23 DIAGNOSIS — N201 Calculus of ureter: Secondary | ICD-10-CM | POA: Diagnosis not present

## 2019-04-23 DIAGNOSIS — Z87442 Personal history of urinary calculi: Secondary | ICD-10-CM | POA: Diagnosis not present

## 2019-04-23 HISTORY — DX: Postnasal drip: R09.82

## 2019-04-23 HISTORY — DX: Duplication of ureter: Q62.5

## 2019-04-23 HISTORY — DX: Gastro-esophageal reflux disease without esophagitis: K21.9

## 2019-04-23 HISTORY — PX: CYSTOSCOPY/URETEROSCOPY/HOLMIUM LASER/STENT PLACEMENT: SHX6546

## 2019-04-23 HISTORY — DX: Diaphragmatic hernia without obstruction or gangrene: K44.9

## 2019-04-23 HISTORY — DX: Calculus of ureter: N20.1

## 2019-04-23 HISTORY — DX: Other seasonal allergic rhinitis: J30.2

## 2019-04-23 LAB — POCT I-STAT, CHEM 8
BUN: 14 mg/dL (ref 8–23)
Calcium, Ion: 1.46 mmol/L — ABNORMAL HIGH (ref 1.15–1.40)
Chloride: 107 mmol/L (ref 98–111)
Creatinine, Ser: 0.7 mg/dL (ref 0.44–1.00)
Glucose, Bld: 103 mg/dL — ABNORMAL HIGH (ref 70–99)
HCT: 43 % (ref 36.0–46.0)
Hemoglobin: 14.6 g/dL (ref 12.0–15.0)
Potassium: 4 mmol/L (ref 3.5–5.1)
Sodium: 142 mmol/L (ref 135–145)
TCO2: 22 mmol/L (ref 22–32)

## 2019-04-23 SURGERY — CYSTOSCOPY/URETEROSCOPY/HOLMIUM LASER/STENT PLACEMENT
Anesthesia: General | Site: Ureter | Laterality: Left

## 2019-04-23 MED ORDER — ONDANSETRON HCL 4 MG/2ML IJ SOLN
4.0000 mg | Freq: Once | INTRAMUSCULAR | Status: DC | PRN
  Filled 2019-04-23: qty 2

## 2019-04-23 MED ORDER — ONDANSETRON HCL 4 MG/2ML IJ SOLN
INTRAMUSCULAR | Status: AC
  Filled 2019-04-23: qty 2

## 2019-04-23 MED ORDER — LIDOCAINE 2% (20 MG/ML) 5 ML SYRINGE
INTRAMUSCULAR | Status: DC | PRN
  Administered 2019-04-23: 100 mg via INTRAVENOUS

## 2019-04-23 MED ORDER — DEXAMETHASONE SODIUM PHOSPHATE 10 MG/ML IJ SOLN
INTRAMUSCULAR | Status: AC
  Filled 2019-04-23: qty 1

## 2019-04-23 MED ORDER — MEPERIDINE HCL 25 MG/ML IJ SOLN
6.2500 mg | INTRAMUSCULAR | Status: DC | PRN
  Filled 2019-04-23: qty 1

## 2019-04-23 MED ORDER — PHENAZOPYRIDINE HCL 200 MG PO TABS: 200.0000 mg | ORAL_TABLET | Freq: Three times a day (TID) | ORAL | 0 refills | Status: AC | PRN

## 2019-04-23 MED ORDER — KETOROLAC TROMETHAMINE 15 MG/ML IJ SOLN
INTRAMUSCULAR | Status: DC | PRN
  Administered 2019-04-23: 15 mg via INTRAVENOUS

## 2019-04-23 MED ORDER — KETOROLAC TROMETHAMINE 30 MG/ML IJ SOLN
INTRAMUSCULAR | Status: AC
  Filled 2019-04-23: qty 1

## 2019-04-23 MED ORDER — LACTATED RINGERS IV SOLN
INTRAVENOUS | Status: DC
  Administered 2019-04-23 (×2): via INTRAVENOUS
  Filled 2019-04-23: qty 1000

## 2019-04-23 MED ORDER — FENTANYL CITRATE (PF) 100 MCG/2ML IJ SOLN
INTRAMUSCULAR | Status: AC
  Filled 2019-04-23: qty 2

## 2019-04-23 MED ORDER — ONDANSETRON HCL 4 MG PO TABS: 4.0000 mg | ORAL_TABLET | Freq: Every day | ORAL | 1 refills | Status: AC | PRN

## 2019-04-23 MED ORDER — FENTANYL CITRATE (PF) 100 MCG/2ML IJ SOLN
25.0000 ug | INTRAMUSCULAR | Status: DC | PRN
  Filled 2019-04-23: qty 1

## 2019-04-23 MED ORDER — MIDAZOLAM HCL 2 MG/2ML IJ SOLN
INTRAMUSCULAR | Status: AC
  Filled 2019-04-23: qty 2

## 2019-04-23 MED ORDER — FENTANYL CITRATE (PF) 100 MCG/2ML IJ SOLN
INTRAMUSCULAR | Status: DC | PRN
  Administered 2019-04-23 (×2): 50 ug via INTRAVENOUS

## 2019-04-23 MED ORDER — PROPOFOL 10 MG/ML IV BOLUS
INTRAVENOUS | Status: DC | PRN
  Administered 2019-04-23: 150 mg via INTRAVENOUS

## 2019-04-23 MED ORDER — CIPROFLOXACIN IN D5W 400 MG/200ML IV SOLN
400.0000 mg | Freq: Once | INTRAVENOUS | Status: AC
  Administered 2019-04-23: 14:00:00 400 mg via INTRAVENOUS
  Filled 2019-04-23: qty 200

## 2019-04-23 MED ORDER — ONDANSETRON HCL 4 MG/2ML IJ SOLN
INTRAMUSCULAR | Status: DC | PRN
  Administered 2019-04-23: 4 mg via INTRAVENOUS

## 2019-04-23 MED ORDER — IOHEXOL 300 MG/ML  SOLN
INTRAMUSCULAR | Status: DC | PRN
  Administered 2019-04-23: 20 mL via URETHRAL

## 2019-04-23 MED ORDER — DEXAMETHASONE SODIUM PHOSPHATE 10 MG/ML IJ SOLN
INTRAMUSCULAR | Status: DC | PRN
  Administered 2019-04-23: 5 mg via INTRAVENOUS

## 2019-04-23 MED ORDER — HYDROCODONE-ACETAMINOPHEN 5-325 MG PO TABS: 1.0000 | ORAL_TABLET | ORAL | 0 refills | Status: DC | PRN

## 2019-04-23 MED ORDER — MIDAZOLAM HCL 5 MG/5ML IJ SOLN
INTRAMUSCULAR | Status: DC | PRN
  Administered 2019-04-23: 1 mg via INTRAVENOUS

## 2019-04-23 MED ORDER — CIPROFLOXACIN IN D5W 400 MG/200ML IV SOLN
INTRAVENOUS | Status: AC
  Filled 2019-04-23: qty 200

## 2019-04-23 SURGICAL SUPPLY — 35 items
APL SKNCLS STERI-STRIP NONHPOA (GAUZE/BANDAGES/DRESSINGS)
BAG DRAIN URO-CYSTO SKYTR STRL (DRAIN) ×3 IMPLANT
BAG DRN UROCATH (DRAIN) ×1
BASKET STONE 1.7 NGAGE (UROLOGICAL SUPPLIES) IMPLANT
BASKET ZERO TIP NITINOL 2.4FR (BASKET) ×3 IMPLANT
BENZOIN TINCTURE PRP APPL 2/3 (GAUZE/BANDAGES/DRESSINGS) IMPLANT
BSKT STON RTRVL ZERO TP 2.4FR (BASKET) ×1
CATH URET 5FR 28IN OPEN ENDED (CATHETERS) ×2 IMPLANT
CLOSURE WOUND 1/2 X4 (GAUZE/BANDAGES/DRESSINGS)
CLOTH BEACON ORANGE TIMEOUT ST (SAFETY) ×3 IMPLANT
FIBER LASER FLEXIVA 365 (UROLOGICAL SUPPLIES) ×2 IMPLANT
FIBER LASER TRAC TIP (UROLOGICAL SUPPLIES) IMPLANT
GLOVE BIO SURGEON STRL SZ 6.5 (GLOVE) ×1 IMPLANT
GLOVE BIO SURGEON STRL SZ7.5 (GLOVE) ×3 IMPLANT
GLOVE BIO SURGEONS STRL SZ 6.5 (GLOVE) ×1
GLOVE BIOGEL PI IND STRL 6.5 (GLOVE) IMPLANT
GLOVE BIOGEL PI INDICATOR 6.5 (GLOVE) ×2
GOWN STRL REUS W/ TWL LRG LVL3 (GOWN DISPOSABLE) IMPLANT
GOWN STRL REUS W/TWL LRG LVL3 (GOWN DISPOSABLE) ×3
GOWN STRL REUS W/TWL XL LVL3 (GOWN DISPOSABLE) ×3 IMPLANT
GUIDEWIRE STR DUAL SENSOR (WIRE) IMPLANT
GUIDEWIRE ZIPWRE .038 STRAIGHT (WIRE) ×3 IMPLANT
IV NS 1000ML (IV SOLUTION)
IV NS 1000ML BAXH (IV SOLUTION) IMPLANT
IV NS IRRIG 3000ML ARTHROMATIC (IV SOLUTION) ×3 IMPLANT
KIT TURNOVER CYSTO (KITS) ×3 IMPLANT
MANIFOLD NEPTUNE II (INSTRUMENTS) ×3 IMPLANT
NS IRRIG 500ML POUR BTL (IV SOLUTION) ×4 IMPLANT
PACK CYSTO (CUSTOM PROCEDURE TRAY) ×3 IMPLANT
STENT URET 6FRX24 CONTOUR (STENTS) ×2 IMPLANT
STRIP CLOSURE SKIN 1/2X4 (GAUZE/BANDAGES/DRESSINGS) IMPLANT
SYR 10ML LL (SYRINGE) ×3 IMPLANT
TUBE CONNECTING 12'X1/4 (SUCTIONS) ×1
TUBE CONNECTING 12X1/4 (SUCTIONS) ×1 IMPLANT
TUBING UROLOGY SET (TUBING) ×3 IMPLANT

## 2019-04-23 NOTE — Anesthesia Procedure Notes (Signed)
Procedure Name: LMA Insertion Date/Time: 04/23/2019 1:57 PM Performed by: Gwyndolyn Saxon, CRNA Pre-anesthesia Checklist: Patient identified, Emergency Drugs available, Suction available and Patient being monitored Patient Re-evaluated:Patient Re-evaluated prior to induction Oxygen Delivery Method: Circle system utilized Preoxygenation: Pre-oxygenation with 100% oxygen Induction Type: IV induction Ventilation: Mask ventilation without difficulty LMA: LMA inserted LMA Size: 3.0 Number of attempts: 1 Placement Confirmation: positive ETCO2 and breath sounds checked- equal and bilateral Tube secured with: Tape Dental Injury: Teeth and Oropharynx as per pre-operative assessment

## 2019-04-23 NOTE — Discharge Instructions (Signed)
[facility] [phone] Post Ureteroscopy With or Without Stent Instructions  Definitions:  Ureter: The duct that transports urine from the kidney to the bladder. Stent:   A plastic hollow tube that is placed into the ureter, from the kidney to the  bladder to prevent the ureter from swelling shut.  GENERAL INSTRUCTIONS:  Despite the fact that no skin incisions were used, the area around the ureter and bladder is raw and irritated. The stent is a foreign body which will further irritate the bladder wall. This irritation is manifested by increased frequency of urination, both day and night, and by an increase in the urge to urinate. In some, the urge to urinate is present almost always. Sometimes the urge is strong enough that you may not be able to stop yourself from urinating. The only real cure is to remove the stent and then give time for the bladder wall to heal which can't be done until the danger of the ureter swelling shut has passed, which varies.  You may see some blood in your urine while the stent is in place and a few days afterwards. Do not be alarmed, even if the urine was clear for a while. Get off your feet and drink lots of fluids until clearing occurs. If you start to pass clots or don't improve, call us.  DIET: You may return to your normal diet immediately. Because of the raw surface of your bladder, alcohol, spicy foods, acid type foods and drinks with caffeine may cause irritation or frequency and should be used in moderation. To keep your urine flowing freely and to avoid constipation, drink plenty of fluids during the day ( 8-10 glasses ). Tip: Avoid cranberry juice because it is very acidic.  ACTIVITY: Your physical activity doesn't need to be restricted. However, if you are very active, you may see some blood in your urine. We suggest that you reduce your activity under these circumstances until the bleeding has stopped.  BOWELS: It is important to  keep your bowels regular during the postoperative period. Straining with bowel movements can cause bleeding. A bowel movement every other day is reasonable. Use a mild laxative if needed, such as Milk of Magnesia 2-3 tablespoons, or 2 Dulcolax tablets. Call if you continue to have problems. If you have been taking narcotics for pain, before, during or after your surgery, you may be constipated. Take a laxative if necessary.   MEDICATION: You should resume your pre-surgery medications unless told not to. In addition you will often be given an antibiotic to prevent infection. These should be taken as prescribed until the bottles are finished unless you are having an unusual reaction to one of the drugs.  PROBLEMS YOU SHOULD REPORT TO [place]:  Fevers over 100.5 Fahrenheit.  Heavy bleeding, or clots ( See above notes about blood in urine ).  Inability to urinate.  Drug reactions ( hives, rash, nausea, vomiting, diarrhea ).  Severe burning or pain with urination that is not improving.  FOLLOW-UP: You will need a follow-up appointment to monitor your progress. Call for this appointment at the number listed above. Usually the first appointment will be about three to fourteen days after your surgery.  Avoid Motrin,Aleve until after 8 pm  Post Anesthesia Home Care Instructions  Activity: Get plenty of rest for the remainder of the day. A responsible individual must stay with you for 24 hours following the procedure.  For the next 24 hours, DO NOT: -Drive a car Film/video editor -  Drink alcoholic beverages -Take any medication unless instructed by your physician -Make any legal decisions or sign important papers.  Meals: Start with liquid foods such as gelatin or soup. Progress to regular foods as tolerated. Avoid greasy, spicy, heavy foods. If nausea and/or vomiting occur, drink only clear liquids until the nausea and/or vomiting subsides. Call your physician if vomiting continues.  Special  Instructions/Symptoms: Your throat may feel dry or sore from the anesthesia or the breathing tube placed in your throat during surgery. If this causes discomfort, gargle with warm salt water. The discomfort should disappear within 24 hours.  If you had a scopolamine patch placed behind your ear for the management of post- operative nausea and/or vomiting:  1. The medication in the patch is effective for 72 hours, after which it should be removed.  Wrap patch in a tissue and discard in the trash. Wash hands thoroughly with soap and water. 2. You may remove the patch earlier than 72 hours if you experience unpleasant side effects which may include dry mouth, dizziness or visual disturbances. 3. Avoid touching the patch. Wash your hands with soap and water after contact with the patch.

## 2019-04-23 NOTE — Anesthesia Postprocedure Evaluation (Signed)
Anesthesia Post Note  Patient: Eustacia K Dier  Procedure(s) Performed: CYSTOSCOPY/URETEROSCOPY/HOLMIUM LASER/STENT PLACEMENT (Left Ureter)     Patient location during evaluation: PACU Anesthesia Type: General Level of consciousness: awake and alert Pain management: pain level controlled Vital Signs Assessment: post-procedure vital signs reviewed and stable Respiratory status: spontaneous breathing, nonlabored ventilation, respiratory function stable and patient connected to nasal cannula oxygen Cardiovascular status: blood pressure returned to baseline and stable Postop Assessment: no apparent nausea or vomiting Anesthetic complications: no    Last Vitals:  Vitals:   04/23/19 1445 04/23/19 1500  BP: (!) 143/82 133/79  Pulse: 69 64  Resp: 17 14  Temp:    SpO2: 100% 96%    Last Pain:  Vitals:   04/23/19 1154  TempSrc: Oral                 Montez Hageman

## 2019-04-23 NOTE — Op Note (Signed)
Operative Note  Preoperative diagnosis:  1.  7 mm left distal ureteral calculus 2.  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  Cystoscopy with left ureteroscopy, holmium laser lithotripsy and left JJ stent placement 2.  Left retrograde pyelogram with intraoperative interpretation of fluoroscopic imaging  Surgeon: Ellison Hughs, MD  Assistants:  None  Anesthesia:  General  Complications:  None  EBL: Less than 5 mL  Specimens: 1.  Left ureteral stone  Drains/Catheters: 1.  Left 6 French, 24 cm JJ stent with tether  Intraoperative findings:   1. Obstructing 7 mm left UVJ calculus 2. Left retrograde pyelogram revealed a partial duplication of the left collecting system with mild proximal ureteral dilation.  There is no other filling defects seen within the upper or lower pole moieties of the renal pelvis  Indication:  Cynthia Dyer is a 68 y.o. female with a history of kidney stones.  She underwent a left ESWL for a distal ureteral calculus, but continued to have radiographic evidence of her stone as well as intermittent episodes of left-sided flank pain.  The patient has been consented for the above procedures, voices understanding and wishes to proceed.  Description of procedure:  After informed consent was obtained, the patient was brought to the operating room and general LMA anesthesia was administered. The patient was then placed in the dorsolithotomy position and prepped and draped in the usual sterile fashion. A timeout was performed. A 23 French rigid cystoscope was then inserted into the urethral meatus and advanced into the bladder under direct vision. A complete bladder survey revealed no intravesical pathology.  A 5 French ureteral catheter was then inserted into the left ureteral orifice and a retrograde pyelogram was obtained, with the findings listed above.  A Glidewire was then used to  intubate the lumen of the ureteral catheter and was advanced up to the left renal pelvis, under fluoroscopic guidance.  The catheter was then removed, leaving the wire in place.  A semirigid ureteroscope was then advanced into the distal aspects of the left ureter where the 7 mm stone was identified.  A 365 m holmium laser was then used to fracture the stone into numerous smaller pieces.  A nitinol tipless basket was then used to extract all stone fragments from the lumen of the left ureter.  A 6 French, 24 cm JJ stent with a tether was placed over the wire and into good position within the upper pole moiety.  The patient's bladder was drained.  She tolerated the procedure well and was transferred to the postanesthesia in stable condition.  Plan: The patient has been instructed to remove her stent at 6 AM on 04/26/2019

## 2019-04-23 NOTE — Anesthesia Preprocedure Evaluation (Addendum)
Anesthesia Evaluation  Patient identified by MRN, date of birth, ID band Patient awake    Reviewed: Allergy & Precautions, NPO status , Patient's Chart, lab work & pertinent test results  Airway Mallampati: II  TM Distance: >3 FB Neck ROM: Full    Dental no notable dental hx. (+) Dental Advisory Given   Pulmonary neg pulmonary ROS,    Pulmonary exam normal breath sounds clear to auscultation       Cardiovascular hypertension, negative cardio ROS Normal cardiovascular exam Rhythm:Regular Rate:Normal     Neuro/Psych negative neurological ROS  negative psych ROS   GI/Hepatic Neg liver ROS, hiatal hernia, GERD  ,  Endo/Other  negative endocrine ROS  Renal/GU negative Renal ROS     Musculoskeletal negative musculoskeletal ROS (+)   Abdominal (+) + obese,   Peds  Hematology negative hematology ROS (+)   Anesthesia Other Findings   Reproductive/Obstetrics negative OB ROS                            Anesthesia Physical Anesthesia Plan  ASA: II  Anesthesia Plan: General   Post-op Pain Management:    Induction: Intravenous  PONV Risk Score and Plan: 4 or greater and Ondansetron, Dexamethasone, Treatment may vary due to age or medical condition, Midazolam and Scopolamine patch - Pre-op  Airway Management Planned: LMA  Additional Equipment:   Intra-op Plan:   Post-operative Plan: Extubation in OR  Informed Consent: I have reviewed the patients History and Physical, chart, labs and discussed the procedure including the risks, benefits and alternatives for the proposed anesthesia with the patient or authorized representative who has indicated his/her understanding and acceptance.     Dental advisory given  Plan Discussed with: CRNA  Anesthesia Plan Comments:         Anesthesia Quick Evaluation

## 2019-04-23 NOTE — H&P (Signed)
Urology Preoperative H&P   Chief Complaint: Left ureteral stone  History of Present Illness: Cynthia Dyer is a 68 y.o. female with a history of kidney stones.   Cynthia Dyer is s/p left ESWL on 3/26 for a 10 mm left UVJ stone, but has not been able to pass all of the stone fragments.  She continues to have intermittent episodes of mild left sided flank pain w/o N/V/F/C, dysuria or gross hematuria.  KUB from 04/08/19 shows residual distal calcifications.      Past Medical History:  Diagnosis Date  . 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 GERD (gastroesophageal reflux disease)   . Hiatal hernia   . History of kidney stones   . Hypertension   . Left ureteral stone   . Post-nasal drip   . Seasonal allergies     Past Surgical History:  Procedure Laterality Date  . ABDOMINAL HYSTERECTOMY  2011   w/ Left Salpingo-oophorectomy  . EXTRACORPOREAL SHOCK WAVE LITHOTRIPSY Left 12/24/2018   Procedure: EXTRACORPOREAL SHOCK WAVE LITHOTRIPSY (ESWL);  Surgeon: Ceasar Mons, MD;  Location: [facility];  Service: Urology;  Laterality: Left;  . THYROID LOBECTOMY Left 09/26/1983   benign growth    Allergies:  Allergies  Allergen Reactions  . Penicillins Hives and Other (See Comments)    Did it involve swelling of the face/tongue/throat, SOB, or low BP? n Did it involve sudden or severe rash/hives, skin peeling, or any reaction on the inside of your mouth or nose? y Did you need to seek medical attention at a hospital or doctor's office? n When did it last happen?30+ If all above answers are "NO", may proceed with cephalosporin use.  . Fiorinal [Butalbital-Aspirin-Caffeine]     Other reaction(s): Doesn't work; "causes severe headache"  . Oxycodone Other (See Comments)    hallucinations  . Bee Venom Rash    No family history on file.  Social History:  reports that she has never smoked. She has never used smokeless tobacco. She reports current  alcohol use. She reports that she does not use drugs.  ROS: A complete review of systems was performed.  All systems are negative except for pertinent findings as noted.  Physical Exam:  Vital signs in last 24 hours:   Constitutional:  Alert and oriented, No acute distress Cardiovascular: Regular rate and rhythm, No JVD Respiratory: Normal respiratory effort, Lungs clear bilaterally GI: Abdomen is soft, nontender, nondistended, no abdominal masses GU: No CVA tenderness Lymphatic: No lymphadenopathy Neurologic: Grossly intact, no focal deficits Psychiatric: Normal mood and affect  Laboratory Data:  No results for input(s): WBC, HGB, HCT, PLT in the last 72 hours.  No results for input(s): NA, K, CL, GLUCOSE, BUN, CALCIUM, CREATININE in the last 72 hours.  Invalid input(s): CO3   No results found for this or any previous visit (from the past 24 hour(s)). Recent Results (from the past 240 hour(s))  SARS Coronavirus 2 (Performed in [place] hospital lab)     Status: None   Collection Time: 04/20/19  9:56 AM   Specimen: Nasal Swab  Result Value Ref Range Status   SARS Coronavirus 2 NEGATIVE NEGATIVE Final    Comment: (NOTE) SARS-CoV-2 target nucleic acids are NOT DETECTED. The SARS-CoV-2 RNA is generally detectable in upper and lower respiratory specimens during the acute phase of infection. Negative results do not preclude SARS-CoV-2 infection, do not rule out co-infections with other pathogens, and should not be used as  the sole basis for treatment or other patient management decisions. Negative results must be combined with clinical observations, patient history, and epidemiological information. The expected result is Negative. Fact Sheet for Patients: SugarRoll.be Fact Sheet for Healthcare Providers: https://www.woods-mathews.com/ This test is not yet approved or cleared by the [place] FDA and  has been authorized for  detection and/or diagnosis of SARS-CoV-2 by FDA under an Emergency Use Authorization (EUA). This EUA will remain  in effect (meaning this test can be used) for the duration of the COVID-19 declaration under Section 56 4(b)(1) of the Act, 21 U.S.C. section 360bbb-3(b)(1), unless the authorization is terminated or revoked sooner. Performed at [facility], [place] [address] [place],  [address]     Renal Function: No results for input(s): CREATININE in the last 168 hours. CrCl cannot be calculated (Patient's most recent lab result is older than the maximum 21 days allowed.).  Radiologic Imaging: No results found.  I independently reviewed the above imaging studies.  Assessment and Plan Shakti K Kazmierczak is a 68 y.o. female with a left ureteral stone   The risks, benefits and alternatives of cystoscopy with LEFT ureteroscopy, laser lithotripsy and ureteral stent placement was discussed the patient.  Risks included, but are not limited to: bleeding, urinary tract infection, ureteral injury/avulsion, ureteral stricture formation, retained stone fragments, the possibility that multiple surgeries may be required to treat the stone(s), MI, stroke, PE and the inherent risks of general anesthesia.  The patient voices understanding and wishes to proceed.      Ellison Hughs, MD 04/23/2019, 11:19 AM  [facility] Pager: [phone]

## 2019-04-23 NOTE — Transfer of Care (Signed)
Immediate Anesthesia Transfer of Care Note  Patient: Cynthia Dyer  Procedure(s) Performed: CYSTOSCOPY/URETEROSCOPY/HOLMIUM LASER/STENT PLACEMENT (Left Ureter)  Patient Location: PACU  Anesthesia Type:General  Level of Consciousness: drowsy  Airway & Oxygen Therapy: Patient Spontanous Breathing and Patient connected to face mask oxygen  Post-op Assessment: Report given to RN and Post -op Vital signs reviewed and stable  Post vital signs: Reviewed and stable  Last Vitals:  Vitals Value Taken Time  BP 108/67 04/23/19 1432  Temp    Pulse 59 04/23/19 1436  Resp 10 04/23/19 1436  SpO2 99 % 04/23/19 1436  Vitals shown include unvalidated device data.  Last Pain:  Vitals:   04/23/19 1154  TempSrc: Oral         Complications: No apparent anesthesia complications

## 2019-04-26 ENCOUNTER — Encounter (HOSPITAL_BASED_OUTPATIENT_CLINIC_OR_DEPARTMENT_OTHER): Payer: Self-pay | Admitting: Urology

## 2019-12-25 ENCOUNTER — Ambulatory Visit: Payer: BC Managed Care – PPO | Attending: Internal Medicine

## 2019-12-25 DIAGNOSIS — Z23 Encounter for immunization: Secondary | ICD-10-CM

## 2019-12-25 NOTE — Progress Notes (Signed)
   Covid-19 Vaccination Clinic  Name:  Cynthia Dyer    MRN: [id number] DOB: 1950/12/05  12/25/2019  [name] was observed post Covid-19 immunization for 15 minutes without incident. She was provided with Vaccine Information Sheet and instruction to access the V-Safe system.   [name] was instructed to call 911 with any severe reactions post vaccine: Marland Kitchen Difficulty breathing  . Swelling of face and throat  . A fast heartbeat  . A bad rash all over body  . Dizziness and weakness   Immunizations Administered    Name Date Dose VIS Date Route   Pfizer COVID-19 Vaccine 12/25/2019  3:21 PM 0.3 mL 09/10/2019 Intramuscular   Manufacturer: [place]   Lot: [id number]   [place]: [id number]

## 2020-01-19 ENCOUNTER — Telehealth: Payer: Self-pay | Admitting: *Deleted

## 2020-01-19 NOTE — Telephone Encounter (Signed)
Patient calling to report she answered joint pain question on pre COVID vaccine survey and was directed to call: Patient has joint pain in her thumb ( occasional)- she had first COVID vaccine without problems- advised second COVID vaccine may give her more SE than first shot- she is aware and will treat as needed.

## 2020-01-24 ENCOUNTER — Ambulatory Visit: Payer: BC Managed Care – PPO | Attending: Emergency Medicine

## 2020-01-24 DIAGNOSIS — Z23 Encounter for immunization: Secondary | ICD-10-CM

## 2020-01-24 NOTE — Progress Notes (Signed)
   Covid-19 Vaccination Clinic  Name:  Cynthia Dyer    MRN: [id number] DOB: May 29, 1951  01/24/2020  [name] was observed post Covid-19 immunization for 15 minutes without incident. She was provided with Vaccine Information Sheet and instruction to access the V-Safe system.   [name] was instructed to call 911 with any severe reactions post vaccine: Marland Kitchen Difficulty breathing  . Swelling of face and throat  . A fast heartbeat  . A bad rash all over body  . Dizziness and weakness   Immunizations Administered    Name Date Dose VIS Date Route   Pfizer COVID-19 Vaccine 01/24/2020  8:08 AM 0.3 mL 11/24/2018 Intramuscular   Manufacturer: [place]   Lot: [id number]   [place]: [id number]

## 2020-10-23 DIAGNOSIS — Z1231 Encounter for screening mammogram for malignant neoplasm of breast: Secondary | ICD-10-CM | POA: Diagnosis not present

## 2021-03-26 DIAGNOSIS — Z13820 Encounter for screening for osteoporosis: Secondary | ICD-10-CM | POA: Diagnosis not present

## 2021-03-26 DIAGNOSIS — Z136 Encounter for screening for cardiovascular disorders: Secondary | ICD-10-CM | POA: Diagnosis not present

## 2021-03-26 DIAGNOSIS — Z23 Encounter for immunization: Secondary | ICD-10-CM | POA: Diagnosis not present

## 2021-03-26 DIAGNOSIS — I10 Essential (primary) hypertension: Secondary | ICD-10-CM | POA: Diagnosis not present

## 2021-03-26 DIAGNOSIS — R7303 Prediabetes: Secondary | ICD-10-CM | POA: Diagnosis not present

## 2021-03-26 DIAGNOSIS — E890 Postprocedural hypothyroidism: Secondary | ICD-10-CM | POA: Diagnosis not present

## 2021-03-26 DIAGNOSIS — Z0000 Encounter for general adult medical examination without abnormal findings: Secondary | ICD-10-CM | POA: Diagnosis not present

## 2021-03-28 ENCOUNTER — Other Ambulatory Visit: Payer: Self-pay | Admitting: Family Medicine

## 2021-03-28 DIAGNOSIS — Z13820 Encounter for screening for osteoporosis: Secondary | ICD-10-CM

## 2021-04-05 DIAGNOSIS — E1169 Type 2 diabetes mellitus with other specified complication: Secondary | ICD-10-CM | POA: Diagnosis not present

## 2021-07-06 DIAGNOSIS — I10 Essential (primary) hypertension: Secondary | ICD-10-CM | POA: Diagnosis not present

## 2021-07-06 DIAGNOSIS — Z23 Encounter for immunization: Secondary | ICD-10-CM | POA: Diagnosis not present

## 2021-07-06 DIAGNOSIS — E1169 Type 2 diabetes mellitus with other specified complication: Secondary | ICD-10-CM | POA: Diagnosis not present

## 2021-11-01 ENCOUNTER — Ambulatory Visit
Admission: RE | Admit: 2021-11-01 | Discharge: 2021-11-01 | Disposition: A | Discharge status: Home / Self Care | Payer: Medicare Other | Source: Ambulatory Visit | Attending: Family Medicine | Admitting: Family Medicine

## 2021-11-01 DIAGNOSIS — Z780 Asymptomatic menopausal state: Secondary | ICD-10-CM | POA: Diagnosis not present

## 2021-11-01 DIAGNOSIS — M8588 Other specified disorders of bone density and structure, other site: Secondary | ICD-10-CM | POA: Diagnosis not present

## 2021-11-01 DIAGNOSIS — Z13820 Encounter for screening for osteoporosis: Secondary | ICD-10-CM

## 2021-11-01 DIAGNOSIS — M810 Age-related osteoporosis without current pathological fracture: Secondary | ICD-10-CM | POA: Diagnosis not present

## 2021-11-02 ENCOUNTER — Other Ambulatory Visit: Payer: Self-pay

## 2021-11-02 ENCOUNTER — Ambulatory Visit (INDEPENDENT_AMBULATORY_CARE_PROVIDER_SITE_OTHER): Payer: Medicare Other | Admitting: Endocrinology

## 2021-11-02 DIAGNOSIS — E069 Thyroiditis, unspecified: Secondary | ICD-10-CM | POA: Diagnosis not present

## 2021-11-02 LAB — T3, FREE: T3, Free: 3.7 pg/mL (ref 2.3–4.2)

## 2021-11-02 LAB — T4, FREE: Free T4: 0.89 ng/dL (ref 0.60–1.60)

## 2021-11-02 LAB — TSH: TSH: 1.46 u[IU]/mL (ref 0.35–5.50)

## 2021-11-02 LAB — VITAMIN D 25 HYDROXY (VIT D DEFICIENCY, FRACTURES): VITD: 29.54 ng/mL — ABNORMAL LOW (ref 30.00–100.00)

## 2021-11-02 NOTE — Progress Notes (Signed)
Subjective:    Patient ID: Cynthia Dyer, female    DOB: Aug 14, 1951, 71 y.o.   MRN: [id number]  HPI Pt is referred by Ammie Dalton, PA, for hypercalcemia.  Pt was noted to have hypercalcemia in 2015. she has never had sarcoidosis, cancer, PUD, pancreatitis, or bony fracture.  He does not take vitamin-A supplement. Pt denies taking antacids, Li++, or HCTZ.  She has urolithiasis.  She intermittently takes Vit-D.  She requests further thyroid blood testing.    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 (CT 0313-2020)   GERD (gastroesophageal reflux disease)    Hiatal hernia    History of kidney stones    Hypertension    Left ureteral stone    Post-nasal drip    Seasonal allergies     Past Surgical History:  Procedure Laterality Date   ABDOMINAL HYSTERECTOMY  2011   w/ Left Salpingo-oophorectomy   CYSTOSCOPY/URETEROSCOPY/HOLMIUM LASER/STENT PLACEMENT Left 04/23/2019   Procedure: CYSTOSCOPY/URETEROSCOPY/HOLMIUM LASER/STENT PLACEMENT;  Surgeon: Ceasar Mons, MD;  Location[facility];  Service: Urology;  Laterality: Left;   EXTRACORPOREAL SHOCK WAVE LITHOTRIPSY Left 12/24/2018   Procedure: EXTRACORPOREAL SHOCK WAVE LITHOTRIPSY (ESWL);  Surgeon: Ceasar Mons, MD;  Location: [facility];  Service: Urology;  Laterality: Left;   THYROID LOBECTOMY Left 09/26/1983   benign growth    Social History   Socioeconomic History   Marital status: Married    Spouse name: Not on file   Number of children: Not on file   Years of education: Not on file   Highest education level: Not on file  Occupational History   Not on file  Tobacco Use   Smoking status: Never   Smokeless tobacco: Never  Vaping Use   Vaping Use: Never used  Substance and Sexual Activity   Alcohol use: Yes    Comment: rare   Drug use: Never   Sexual activity: Not on file  Other Topics Concern   Not on file  Social History  Narrative   Not on file   Social Determinants of Health   Financial Resource Strain: Not on file  Food Insecurity: Not on file  Transportation Needs: Not on file  Physical Activity: Not on file  Stress: Not on file  Social Connections: Not on file  Intimate Partner Violence: Not on file    Current Outpatient Medications on File Prior to Visit  Medication Sig Dispense Refill   aspirin-acetaminophen-caffeine (EXCEDRIN MIGRAINE) 250-250-65 MG tablet Take 2 tablets by mouth daily as needed for headache or migraine.     famotidine-calcium carbonate-magnesium hydroxide (PEPCID COMPLETE) 10-800-165 MG chewable tablet Chew 1 tablet by mouth as needed.     LUTEIN PO Take by mouth daily.     MAGNESIUM PO Take by mouth daily.     Menaquinone-7 (VITAMIN K2 PO) Take by mouth daily.     Misc Natural Products (TART CHERRY ADVANCED PO) Take by mouth daily.     NIACIN PO Take by mouth. Niacin, C, B6, B12, Folate     TURMERIC CURCUMIN PO Take by mouth daily.     HYDROcodone-acetaminophen (NORCO) 5-325 MG tablet Take 1 tablet by mouth every 6 (six) hours as needed for severe pain. 10 tablet 0   HYDROcodone-acetaminophen (NORCO) 5-325 MG tablet Take 1 tablet by mouth every 4 (four) hours as needed for moderate pain. 20 tablet 0   naproxen (NAPROSYN) 500 MG tablet Take 1 tablet (  500 mg total) by mouth 2 (two) times daily as needed for mild pain, moderate pain or headache (TAKE WITH MEALS.). (Patient not taking: Reported on 04/21/2019) 20 tablet 0   ondansetron (ZOFRAN ODT) 4 MG disintegrating tablet Take 1 tablet (4 mg total) by mouth every 8 (eight) hours as needed for nausea or vomiting. 15 tablet 0   tamsulosin (FLOMAX) 0.4 MG CAPS capsule Take 1 capsule (0.4 mg total) by mouth daily after supper. Take until the stone passes, then stop taking 30 capsule 1   No current facility-administered medications on file prior to visit.    Allergies  Allergen Reactions   Penicillins Hives and Other (See  Comments)    Did it involve swelling of the face/tongue/throat, SOB, or low BP? n Did it involve sudden or severe rash/hives, skin peeling, or any reaction on the inside of your mouth or nose? y Did you need to seek medical attention at a hospital or doctor's office? n When did it last happen? 30+ If all above answers are "NO", may proceed with cephalosporin use.   Fiorinal [Butalbital-Aspirin-Caffeine]     Other reaction(s): Doesn't work; "causes severe headache"   Oxycodone Other (See Comments)    hallucinations   Bee Venom Rash    No family history on file.  BP (!) 200/110    Pulse (!) 104    Ht 5\' 2"  (1.575 m)    Wt 170 lb 9.6 oz (77.4 kg)    SpO2 97%    BMI 31.20 kg/m    Review of Systems Denies weight loss, polyuria, falls, hematuria, numbness, and back pain.      Objective:   Physical Exam VITAL SIGNS:  See vs page GENERAL: no distress NECK: I do not appreciate a nodule in the thyroid or elsewhere in the neck.   SPINE: no kyphosis.  GAIT: normal and steady.    outside test results are reviewed: PTH=60 Ca++=11.5 TSH=1.9 Creat=0.8 BMD (2023) LFN is 0.685 g/cm2 T-score of -2.5.  I have reviewed outside records, and summarized: Pt was noted to have elevated Ca++, and referred here.  Main prob addressed was DM.  HCTZ was stopped    Assessment & Plan:  Hypercalcemia: uncertain etiology and prognosis  Patient Instructions  Your blood pressure is high today.  Please see your primary care provider soon, to have it rechecked.   Blood tests are requested for you today.  We'll let you know about the results.   If these tests don't tell us the cause of the high blood calcium, we should check a 24HR urine calcium collection.   After we have these results, you should take a once per month pill for osteoporosis.

## 2021-11-02 NOTE — Patient Instructions (Addendum)
Your blood pressure is high today.  Please see your primary care provider soon, to have it rechecked.   Blood tests are requested for you today.  We'll let you know about the results.   If these tests don't tell us the cause of the high blood calcium, we should check a 24HR urine calcium collection.   After we have these results, you should take a once per month pill for osteoporosis.

## 2021-11-09 ENCOUNTER — Other Ambulatory Visit: Payer: Self-pay | Admitting: Endocrinology

## 2021-11-09 DIAGNOSIS — E210 Primary hyperparathyroidism: Secondary | ICD-10-CM

## 2021-11-09 LAB — VITAMIN D 1,25 DIHYDROXY
Vitamin D 1, 25 (OH)2 Total: 83 pg/mL — ABNORMAL HIGH (ref 18–72)
Vitamin D2 1, 25 (OH)2: 8 pg/mL
Vitamin D3 1, 25 (OH)2: 75 pg/mL

## 2021-11-09 LAB — PROTEIN ELECTROPHORESIS, SERUM
Albumin ELP: 4.4 g/dL (ref 3.8–4.8)
Alpha 1: 0.3 g/dL (ref 0.2–0.3)
Alpha 2: 0.9 g/dL (ref 0.5–0.9)
Beta 2: 0.4 g/dL (ref 0.2–0.5)
Beta Globulin: 0.4 g/dL (ref 0.4–0.6)
Gamma Globulin: 1.1 g/dL (ref 0.8–1.7)
Total Protein: 7.5 g/dL (ref 6.1–8.1)

## 2021-11-09 LAB — CALCIUM, IONIZED: Calcium, Ion: 6.32 mg/dL — ABNORMAL HIGH (ref 4.8–5.6)

## 2021-11-09 LAB — EXTRA SPECIMEN

## 2021-11-09 LAB — ALKALINE PHOSPHATASE, BONE SPECIFIC: ALKALINE PHOSPHATASE, BONE SPECIFIC: 50 mcg/L — ABNORMAL HIGH (ref 5.6–29.0)

## 2021-11-09 LAB — PTH, INTACT AND CALCIUM
Calcium: 12.2 mg/dL — ABNORMAL HIGH (ref 8.6–10.4)
PTH: 113 pg/mL — ABNORMAL HIGH (ref 16–77)

## 2021-11-09 LAB — PTH-RELATED PEPTIDE: PTH-Related Protein (PTH-RP): 11 pg/mL (ref 11–20)

## 2021-11-09 LAB — VITAMIN A: Vitamin A (Retinoic Acid): 36 ug/dL — ABNORMAL LOW (ref 38–98)

## 2021-11-09 LAB — THYROID PEROXIDASE ANTIBODY: Thyroperoxidase Ab SerPl-aCnc: 12 IU/mL — ABNORMAL HIGH (ref <9)

## 2022-01-08 DIAGNOSIS — I10 Essential (primary) hypertension: Secondary | ICD-10-CM | POA: Diagnosis not present

## 2022-01-08 DIAGNOSIS — E1169 Type 2 diabetes mellitus with other specified complication: Secondary | ICD-10-CM | POA: Diagnosis not present

## 2022-01-29 DIAGNOSIS — I10 Essential (primary) hypertension: Secondary | ICD-10-CM | POA: Diagnosis not present

## 2022-03-13 DIAGNOSIS — E213 Hyperparathyroidism, unspecified: Secondary | ICD-10-CM | POA: Diagnosis not present

## 2022-03-13 DIAGNOSIS — E210 Primary hyperparathyroidism: Secondary | ICD-10-CM | POA: Diagnosis not present

## 2022-03-26 ENCOUNTER — Other Ambulatory Visit: Payer: Self-pay | Admitting: Surgery

## 2022-03-26 ENCOUNTER — Other Ambulatory Visit (HOSPITAL_COMMUNITY): Payer: Self-pay | Admitting: Surgery

## 2022-03-26 DIAGNOSIS — E210 Primary hyperparathyroidism: Secondary | ICD-10-CM

## 2022-04-08 ENCOUNTER — Encounter (HOSPITAL_COMMUNITY)
Admission: RE | Admit: 2022-04-08 | Discharge: 2022-04-08 | Disposition: A | Discharge status: Home / Self Care | Payer: Medicare Other | Source: Ambulatory Visit | Attending: Surgery | Admitting: Surgery

## 2022-04-08 DIAGNOSIS — E210 Primary hyperparathyroidism: Secondary | ICD-10-CM | POA: Diagnosis not present

## 2022-04-08 DIAGNOSIS — E041 Nontoxic single thyroid nodule: Secondary | ICD-10-CM | POA: Diagnosis not present

## 2022-04-08 DIAGNOSIS — E042 Nontoxic multinodular goiter: Secondary | ICD-10-CM | POA: Diagnosis not present

## 2022-04-08 DIAGNOSIS — E213 Hyperparathyroidism, unspecified: Secondary | ICD-10-CM | POA: Diagnosis not present

## 2022-04-08 MED ORDER — TECHNETIUM TC 99M SESTAMIBI - CARDIOLITE
25.2000 | Freq: Once | INTRAVENOUS | Status: AC | PRN
  Administered 2022-04-08: 25.2 via INTRAVENOUS

## 2022-04-09 NOTE — Progress Notes (Signed)
Given that we are planning parathyroid surgery, we should proceed with FNA biopsy of the right sided 2.9 cm nodule as recommended by radiology.  Claiborne Billings - please arrange FNA biopsy with ultrasound guidance by [facility].  I will be in touch with results and then decide on proceeding with parathyroid surgery.  [place], MD [facility] [place] practice Office: [phone]

## 2022-04-11 ENCOUNTER — Other Ambulatory Visit: Payer: Self-pay | Admitting: Surgery

## 2022-04-11 DIAGNOSIS — E041 Nontoxic single thyroid nodule: Secondary | ICD-10-CM

## 2022-04-23 DIAGNOSIS — E1169 Type 2 diabetes mellitus with other specified complication: Secondary | ICD-10-CM | POA: Diagnosis not present

## 2022-04-23 DIAGNOSIS — Z1239 Encounter for other screening for malignant neoplasm of breast: Secondary | ICD-10-CM | POA: Diagnosis not present

## 2022-04-23 DIAGNOSIS — I10 Essential (primary) hypertension: Secondary | ICD-10-CM | POA: Diagnosis not present

## 2022-04-23 DIAGNOSIS — Z1211 Encounter for screening for malignant neoplasm of colon: Secondary | ICD-10-CM | POA: Diagnosis not present

## 2022-04-23 DIAGNOSIS — M810 Age-related osteoporosis without current pathological fracture: Secondary | ICD-10-CM | POA: Diagnosis not present

## 2022-04-23 DIAGNOSIS — Z0000 Encounter for general adult medical examination without abnormal findings: Secondary | ICD-10-CM | POA: Diagnosis not present

## 2022-04-23 DIAGNOSIS — Z23 Encounter for immunization: Secondary | ICD-10-CM | POA: Diagnosis not present

## 2022-04-25 ENCOUNTER — Ambulatory Visit
Admission: RE | Admit: 2022-04-25 | Discharge: 2022-04-25 | Disposition: A | Discharge status: Home / Self Care | Payer: Medicare Other | Source: Ambulatory Visit | Attending: Surgery | Admitting: Surgery

## 2022-04-25 ENCOUNTER — Other Ambulatory Visit (HOSPITAL_COMMUNITY)
Admission: RE | Admit: 2022-04-25 | Discharge: 2022-04-25 | Disposition: A | Discharge status: Home / Self Care | Payer: Medicare Other | Source: Ambulatory Visit | Attending: Surgery | Admitting: Surgery

## 2022-04-25 DIAGNOSIS — E041 Nontoxic single thyroid nodule: Secondary | ICD-10-CM | POA: Diagnosis not present

## 2022-04-26 LAB — CYTOLOGY - NON PAP

## 2022-05-01 ENCOUNTER — Ambulatory Visit: Payer: Self-pay | Admitting: Surgery

## 2022-05-01 NOTE — Progress Notes (Signed)
FNA biopsy is benign.  Will proceed with scheduling patient for parathyroid surgery in the near future.  Claiborne Billings - please send orders to scheduling.  [place], [place] [facility] [place] practice Office: [phone]

## 2022-05-15 NOTE — Patient Instructions (Signed)
DUE TO COVID-19 ONLY TWO VISITORS  (aged 69 and older)  ARE ALLOWED TO COME WITH YOU AND STAY IN THE WAITING ROOM ONLY DURING PRE OP AND PROCEDURE.   **NO VISITORS ARE ALLOWED IN THE SHORT STAY AREA OR RECOVERY ROOM!!**  IF YOU WILL BE ADMITTED INTO THE HOSPITAL YOU ARE ALLOWED ONLY FOUR SUPPORT PEOPLE DURING VISITATION HOURS ONLY (7 AM -8PM)   The support person(s) must pass our screening, gel in and out, and wear a mask at all times, including in the patient's room. Patients must also wear a mask when staff or their support person are in the room. Visitors GUEST BADGE MUST BE WORN VISIBLY  One adult visitor may remain with you overnight and MUST be in the room by 8 P.M.     Your procedure is scheduled on: 05/20/22   Report to [facility] Main Entrance    Report to admitting at : 10:45 AM   Call this number if you have problems the morning of surgery [phone]   Do not eat food :After Midnight.   After Midnight you may have the following liquids until: 10:00 AM DAY OF SURGERY  Water Black Coffee (sugar ok, NO MILK/CREAM OR CREAMERS)  Tea (sugar ok, NO MILK/CREAM OR CREAMERS) regular and decaf                             Plain Jell-O (NO RED)                                           Fruit ices (not with fruit pulp, NO RED)                                     Popsicles (NO RED)                                                                  Juice: apple, WHITE grape, WHITE cranberry Sports drinks like Gatorade (NO RED)              FOLLOW BOWEL PREP AND ANY ADDITIONAL PRE OP INSTRUCTIONS YOU RECEIVED FROM YOUR SURGEON'S OFFICE!!!   Oral Hygiene is also important to reduce your risk of infection.                                    Remember - BRUSH YOUR TEETH THE MORNING OF SURGERY WITH YOUR REGULAR TOOTHPASTE   Do NOT smoke after Midnight   Take these medicines the morning of surgery with A SIP OF WATER: Tylenol as needed.  DO NOT TAKE ANY ORAL DIABETIC MEDICATIONS  DAY OF YOUR SURGERY  Bring CPAP mask and tubing day of surgery.                              You may not have any metal on your body including hair pins, jewelry, and body piercing  Do not wear make-up, lotions, powders, perfumes/cologne, or deodorant  Do not wear nail polish including gel and S&S, artificial/acrylic nails, or any other type of covering on natural nails including finger and toenails. If you have artificial nails, gel coating, etc. that needs to be removed by a nail salon please have this removed prior to surgery or surgery may need to be canceled/ delayed if the surgeon/ anesthesia feels like they are unable to be safely monitored.   Do not shave  48 hours prior to surgery.    Do not bring valuables to the hospital. [place].   Contacts, dentures or bridgework may not be worn into surgery.   Bring small overnight bag day of surgery.   DO NOT [place]. PHARMACY WILL DISPENSE MEDICATIONS LISTED ON YOUR MEDICATION LIST TO YOU DURING YOUR ADMISSION [place]!    Patients discharged on the day of surgery will not be allowed to drive home.  Someone NEEDS to stay with you for the first 24 hours after anesthesia.   Special Instructions: Bring a copy of your healthcare power of attorney and living will documents         the day of surgery if you haven't scanned them before.              Please read over the following fact sheets you were given: IF YOU HAVE QUESTIONS ABOUT YOUR PRE-OP INSTRUCTIONS PLEASE CALL [phone]     [facility] - Preparing for Surgery Before surgery, you can play an important role.  Because skin is not sterile, your skin needs to be as free of germs as possible.  You can reduce the number of germs on your skin by washing with CHG (chlorahexidine gluconate) soap before surgery.  CHG is an antiseptic cleaner which kills germs and bonds with the skin to  continue killing germs even after washing. Please DO NOT use if you have an allergy to CHG or antibacterial soaps.  If your skin becomes reddened/irritated stop using the CHG and inform your nurse when you arrive at Short Stay. Do not shave (including legs and underarms) for at least 48 hours prior to the first CHG shower.  You may shave your face/neck. Please follow these instructions carefully:  1.  Shower with CHG Soap the night before surgery and the  morning of Surgery.  2.  If you choose to wash your hair, wash your hair first as usual with your  normal  shampoo.  3.  After you shampoo, rinse your hair and body thoroughly to remove the  shampoo.                           4.  Use CHG as you would any other liquid soap.  You can apply chg directly  to the skin and wash                       Gently with a scrungie or clean washcloth.  5.  Apply the CHG Soap to your body ONLY FROM THE NECK DOWN.   Do not use on face/ open                           Wound or open sores. Avoid contact with  eyes, ears mouth and genitals (private parts).                       Wash face,  Genitals (private parts) with your normal soap.             6.  Wash thoroughly, paying special attention to the area where your surgery  will be performed.  7.  Thoroughly rinse your body with warm water from the neck down.  8.  DO NOT shower/wash with your normal soap after using and rinsing off  the CHG Soap.                9.  Pat yourself dry with a clean towel.            10.  Wear clean pajamas.            11.  Place clean sheets on your bed the night of your first shower and do not  sleep with pets. Day of Surgery : Do not apply any lotions/deodorants the morning of surgery.  Please wear clean clothes to the hospital/surgery center.  FAILURE TO FOLLOW THESE INSTRUCTIONS MAY RESULT IN THE CANCELLATION OF YOUR SURGERY PATIENT SIGNATURE_________________________________  NURSE  SIGNATURE__________________________________  ________________________________________________________________________

## 2022-05-16 ENCOUNTER — Encounter (HOSPITAL_COMMUNITY)
Admission: RE | Admit: 2022-05-16 | Discharge: 2022-05-16 | Disposition: A | Discharge status: Home / Self Care | Payer: Medicare Other | Source: Ambulatory Visit | Attending: Surgery | Admitting: Surgery

## 2022-05-16 ENCOUNTER — Encounter (HOSPITAL_COMMUNITY): Payer: Self-pay

## 2022-05-16 ENCOUNTER — Other Ambulatory Visit: Payer: Self-pay

## 2022-05-16 VITALS — BP 155/75 | HR 63 | Temp 98.4°F | Ht 61.0 in | Wt 171.0 lb

## 2022-05-16 DIAGNOSIS — Z01818 Encounter for other preprocedural examination: Secondary | ICD-10-CM | POA: Diagnosis not present

## 2022-05-16 DIAGNOSIS — R7303 Prediabetes: Secondary | ICD-10-CM | POA: Insufficient documentation

## 2022-05-16 DIAGNOSIS — I2510 Atherosclerotic heart disease of native coronary artery without angina pectoris: Secondary | ICD-10-CM | POA: Diagnosis not present

## 2022-05-16 HISTORY — DX: Prediabetes: R73.03

## 2022-05-16 HISTORY — DX: Anemia, unspecified: D64.9

## 2022-05-16 LAB — CBC
HCT: 42.1 % (ref 36.0–46.0)
Hemoglobin: 14.3 g/dL (ref 12.0–15.0)
MCH: 30.2 pg (ref 26.0–34.0)
MCHC: 34 g/dL (ref 30.0–36.0)
MCV: 88.8 fL (ref 80.0–100.0)
Platelets: 260 10*3/uL (ref 150–400)
RBC: 4.74 MIL/uL (ref 3.87–5.11)
RDW: 12.4 % (ref 11.5–15.5)
WBC: 7.9 10*3/uL (ref 4.0–10.5)
nRBC: 0 % (ref 0.0–0.2)

## 2022-05-16 LAB — BASIC METABOLIC PANEL
Anion gap: 9 (ref 5–15)
BUN: 19 mg/dL (ref 8–23)
CO2: 27 mmol/L (ref 22–32)
Calcium: 11.9 mg/dL — ABNORMAL HIGH (ref 8.9–10.3)
Chloride: 105 mmol/L (ref 98–111)
Creatinine, Ser: 0.8 mg/dL (ref 0.44–1.00)
GFR, Estimated: >60 mL/min (ref >60)
Glucose, Bld: 161 mg/dL — ABNORMAL HIGH (ref 70–99)
Potassium: 4.2 mmol/L (ref 3.5–5.1)
Sodium: 141 mmol/L (ref 135–145)

## 2022-05-16 LAB — HEMOGLOBIN A1C
Hgb A1c MFr Bld: 6.9 % — ABNORMAL HIGH (ref 4.8–5.6)
Mean Plasma Glucose: 151.33 mg/dL

## 2022-05-16 LAB — GLUCOSE, CAPILLARY: Glucose-Capillary: 170 mg/dL — ABNORMAL HIGH (ref 70–99)

## 2022-05-16 NOTE — Progress Notes (Signed)
For Short Stay: [place] appointment date: Date of COVID positive in last 50 days:  Bowel Prep reminder:   For Anesthesia: PCP - Dr. Orpah Melter Cardiologist -   Chest x-ray -  EKG -  Stress Test -  ECHO -  Cardiac Cath -  Pacemaker/ICD device last checked: Pacemaker orders received: Device Rep notified:  Spinal Cord Stimulator:  Sleep Study -  CPAP -   Fasting Blood Sugar - N/A Checks Blood Sugar ___0__ times a day Date and result of last Hgb A1c-  Blood Thinner Instructions: Aspirin Instructions: Last Dose:  Activity level: Can go up a flight of stairs and activities of daily living without stopping and without chest pain and/or shortness of breath   Able to exercise without chest pain and/or shortness of breath   Unable to go up a flight of stairs without chest pain and/or shortness of breath     Anesthesia review: Hx: HTN,Pre-DIA  Patient denies shortness of breath, fever, cough and chest pain at PAT appointment   Patient verbalized understanding of instructions that were given to them at the PAT appointment. Patient was also instructed that they will need to review over the PAT instructions again at home before surgery.

## 2022-05-19 ENCOUNTER — Encounter (HOSPITAL_COMMUNITY): Payer: Self-pay | Admitting: Surgery

## 2022-05-19 NOTE — H&P (Signed)
REFERRING PHYSICIAN: Ammie Dalton  PROVIDER: Kacen Mellinger Charlotta Newton, MD   Chief Complaint: New Consultation (Primary hyperparathyroidism)  History of Present Illness:  Patient is referred by Ammie Dalton, PA, at [place] at [facility]. Patient had previously been seen by Dr. Renato Shin. Patient is referred for surgical evaluation and management of primary hyperparathyroidism. Patient had been noted to have elevated serum calcium levels for the past 4 to 5 years. She has developed complications including osteoporosis, nephrolithiasis, and chronic fatigue. Recent laboratory studies show an elevated calcium level of 12.2 and an elevated intact PTH level of 113. Recent vitamin D level was normal at the low range of normal. Patient has not had any recent imaging studies performed. She does have a history of neck surgery having undergone a left thyroid lobectomy in 1984 by Dr. Lennie Hummer. This was for benign disease. 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 There is no family history of parathyroid disease or other endocrine neoplasms. Patient is retired. She is accompanied by her husband.  Review of Systems: A complete review of systems was obtained from the patient. I have reviewed this information and discussed as appropriate with the patient. See HPI as well for other ROS.  Review of Systems  Constitutional: Positive for malaise/fatigue.  HENT: Negative.  Eyes: Negative.  Respiratory: Negative.  Cardiovascular: Negative.  Gastrointestinal: Negative.  Genitourinary:  Nephrolithiasis  Musculoskeletal: Negative.  Skin: Negative.  Neurological: Negative.  Endo/Heme/Allergies: Negative.  Psychiatric/Behavioral: Negative.   Medical History: Past Medical History:  Diagnosis Date  Diabetes mellitus without complication (CMS-HCC)  Hypertension   Thyroid disease   Patient Active Problem List  Diagnosis  Primary hyperparathyroidism (CMS-HCC)   Past Surgical History:  Procedure Laterality Date  HYSTERECTOMY  THYROIDECTOMY TOTAL    Allergies  Allergen Reactions  Penicillins Hives, Other (See Comments) and Rash  Did it involve swelling of the face/tongue/throat, SOB, or low BP? n Did it involve sudden or severe rash/hives, skin peeling, or any reaction on the inside of your mouth or nose? y Did you need to seek medical attention at a hospital or doctor's office? n When did it last happen? 30+ If all above answers are "NO", may proceed with cephalosporin use.  Aspartame Other (See Comments)  Butalbital-Aspirin-Caffeine Rash and Anxiety  Other reaction(s): Doesn't work; "causes severe headache"  Oxycodone Other (See Comments) and Hives  hallucinations  Oxycodone-Acetaminophen Nausea and Unknown  Venom-Honey Bee Rash   Current Outpatient Medications on File Prior to Visit  Medication Sig Dispense Refill  simvastatin (ZOCOR) 10 MG tablet TAKE 1 TABLET BY MOUTH EVERY DAY IN THE EVENING FOR 90 DAYS  bisoproloL-hydroCHLOROthiazide (ZIAC) 5-6.25 mg tablet Take 1 tablet by mouth once daily  losartan (COZAAR) 50 MG tablet 1 tablet   No current facility-administered medications on file prior to visit.   Family History  Problem Relation Age of Onset  Colon cancer Maternal Uncle    Social History   Tobacco Use  Smoking Status Never  Smokeless Tobacco Never    Social History   Socioeconomic History  Marital status: Married  Tobacco Use  Smoking status: Never  Smokeless tobacco: Never  Substance and Sexual Activity  Alcohol use: Yes  Drug use: Never   Objective:   Vitals:  BP: (!) 140/90  Pulse: 71  Temp: 36.4 C (97.5 F)  SpO2: 97%  Weight: 77.3  kg (170 lb 6.4 oz)  Height: 154.9 cm ('5\' 1"'$ )   Body mass index is 32.2 kg/m.  Physical Exam   GENERAL APPEARANCE Comfortable, no acute issues Development:  normal Gross deformities: none  SKIN Rash, lesions, ulcers: none Induration, erythema: none Nodules: none palpable  EYES Conjunctiva and lids: normal Pupils: equal and reactive  EARS, NOSE, MOUTH, THROAT External ears: no lesion or deformity External nose: no lesion or deformity Hearing: grossly normal  NECK Symmetric: no Trachea: midline Thyroid: Right thyroid lobe is enlarged, smooth, slightly firm, and nontender. There is a well-healed anterior cervical incision in the low neck centrally. Left thyroid lobe is without palpable abnormality. There is no associated lymphadenopathy.  CHEST Respiratory effort: normal Retraction or accessory muscle use: no Breath sounds: normal bilaterally Rales, rhonchi, wheeze: none  CARDIOVASCULAR Auscultation: regular rhythm, normal rate Murmurs: none Pulses: radial pulse 2+ palpable Lower extremity edema: none  ABDOMEN Not assessed  GENITOURINARY Not assessed  MUSCULOSKELETAL Station and gait: normal Digits and nails: no clubbing or cyanosis Muscle strength: grossly normal all extremities Range of motion: grossly normal all extremities Deformity: none  LYMPHATIC Cervical: none palpable Supraclavicular: none palpable  PSYCHIATRIC Oriented to person, place, and time: yes Mood and affect: normal for situation Judgment and insight: appropriate for situation    Assessment and Plan:   Primary hyperparathyroidism (CMS-HCC)  Patient is referred by her primary care provider for surgical evaluation and management of primary hyperparathyroidism.  Patient provided with a copy of "The Thyroid Book: Medical and Surgical Treatment of Thyroid Problems", published by [employer], 16 pages. Book reviewed and explained to patient during visit today.  Patient has biochemical evidence of primary hyperparathyroidism. She has not had any recent imaging studies performed. I would like to proceed with a repeat ultrasound examination of the neck  to evaluate both the thyroid and potentially identify an enlarged parathyroid gland. I would also like to obtain a nuclear medicine parathyroid scan in hopes of localizing the parathyroid adenoma. If the studies are successful, then I believe she will be a good candidate for minimally invasive outpatient surgery which we discussed today. If the studies failed to identify the adenoma, then we will make arrangements for the patient to undergo a 4D CT scan of the neck. The patient understands and agrees to proceed with the studies.  We discussed parathyroid surgery. We discussed minimally invasive surgery versus the traditional neck exploration. We discussed the hospital stay to be anticipated. We discussed the size and location of the surgical incisions. The patient understands and agrees to proceed with further evaluation with imaging studies in anticipation of surgery.  Armandina Gemma, MD [facility] [facility] [place] practice Office: [phone]

## 2022-05-20 ENCOUNTER — Ambulatory Visit (HOSPITAL_BASED_OUTPATIENT_CLINIC_OR_DEPARTMENT_OTHER): Payer: Medicare Other | Admitting: Anesthesiology

## 2022-05-20 ENCOUNTER — Ambulatory Visit (HOSPITAL_COMMUNITY): Payer: Medicare Other | Admitting: Anesthesiology

## 2022-05-20 ENCOUNTER — Other Ambulatory Visit: Payer: Self-pay

## 2022-05-20 ENCOUNTER — Encounter (HOSPITAL_COMMUNITY)
Admission: RE | Disposition: A | Discharge status: Home / Self Care | Payer: Self-pay | Source: Home / Self Care | Attending: Surgery

## 2022-05-20 ENCOUNTER — Ambulatory Visit (HOSPITAL_COMMUNITY)
Admission: RE | Admit: 2022-05-20 | Discharge: 2022-05-20 | Disposition: A | Discharge status: Home / Self Care | Payer: Medicare Other | Attending: Surgery | Admitting: Surgery

## 2022-05-20 ENCOUNTER — Encounter (HOSPITAL_COMMUNITY): Payer: Self-pay | Admitting: Surgery

## 2022-05-20 DIAGNOSIS — E042 Nontoxic multinodular goiter: Secondary | ICD-10-CM | POA: Insufficient documentation

## 2022-05-20 DIAGNOSIS — E210 Primary hyperparathyroidism: Secondary | ICD-10-CM | POA: Diagnosis present

## 2022-05-20 DIAGNOSIS — R9389 Abnormal findings on diagnostic imaging of other specified body structures: Secondary | ICD-10-CM | POA: Diagnosis not present

## 2022-05-20 DIAGNOSIS — D351 Benign neoplasm of parathyroid gland: Secondary | ICD-10-CM | POA: Diagnosis not present

## 2022-05-20 DIAGNOSIS — R5382 Chronic fatigue, unspecified: Secondary | ICD-10-CM | POA: Diagnosis not present

## 2022-05-20 DIAGNOSIS — N200 Calculus of kidney: Secondary | ICD-10-CM | POA: Insufficient documentation

## 2022-05-20 DIAGNOSIS — I10 Essential (primary) hypertension: Secondary | ICD-10-CM | POA: Insufficient documentation

## 2022-05-20 DIAGNOSIS — M810 Age-related osteoporosis without current pathological fracture: Secondary | ICD-10-CM | POA: Diagnosis not present

## 2022-05-20 HISTORY — PX: PARATHYROIDECTOMY: SHX19

## 2022-05-20 SURGERY — PARATHYROIDECTOMY
Anesthesia: General | Site: Abdomen

## 2022-05-20 MED ORDER — FENTANYL CITRATE PF 50 MCG/ML IJ SOSY
PREFILLED_SYRINGE | INTRAMUSCULAR | Status: AC
  Filled 2022-05-20: qty 1

## 2022-05-20 MED ORDER — CIPROFLOXACIN IN D5W 400 MG/200ML IV SOLN
400.0000 mg | INTRAVENOUS | Status: AC
  Administered 2022-05-20: 400 mg via INTRAVENOUS
  Filled 2022-05-20: qty 200

## 2022-05-20 MED ORDER — LIDOCAINE HCL (CARDIAC) PF 100 MG/5ML IV SOSY
PREFILLED_SYRINGE | INTRAVENOUS | Status: DC | PRN
  Administered 2022-05-20: 80 mg via INTRAVENOUS

## 2022-05-20 MED ORDER — DEXAMETHASONE SODIUM PHOSPHATE 10 MG/ML IJ SOLN
INTRAMUSCULAR | Status: AC
  Filled 2022-05-20: qty 1

## 2022-05-20 MED ORDER — MIDAZOLAM HCL 2 MG/2ML IJ SOLN
INTRAMUSCULAR | Status: AC
  Filled 2022-05-20: qty 2

## 2022-05-20 MED ORDER — ACETAMINOPHEN 325 MG PO TABS: 325.0000 mg | ORAL_TABLET | ORAL | Status: DC | PRN

## 2022-05-20 MED ORDER — EPHEDRINE SULFATE (PRESSORS) 50 MG/ML IJ SOLN
INTRAMUSCULAR | Status: DC | PRN
  Administered 2022-05-20: 10 mg via INTRAVENOUS
  Administered 2022-05-20: 5 mg via INTRAVENOUS

## 2022-05-20 MED ORDER — 0.9 % SODIUM CHLORIDE (POUR BTL) OPTIME
TOPICAL | Status: DC | PRN
  Administered 2022-05-20: 1000 mL

## 2022-05-20 MED ORDER — MIDAZOLAM HCL 5 MG/5ML IJ SOLN
INTRAMUSCULAR | Status: DC | PRN
  Administered 2022-05-20: 1 mg via INTRAVENOUS

## 2022-05-20 MED ORDER — FENTANYL CITRATE PF 50 MCG/ML IJ SOSY
25.0000 ug | PREFILLED_SYRINGE | INTRAMUSCULAR | Status: DC | PRN
  Administered 2022-05-20: 50 ug via INTRAVENOUS

## 2022-05-20 MED ORDER — ACETAMINOPHEN 160 MG/5ML PO SOLN: 325.0000 mg | ORAL | Status: DC | PRN

## 2022-05-20 MED ORDER — AMISULPRIDE (ANTIEMETIC) 5 MG/2ML IV SOLN
10.0000 mg | Freq: Once | INTRAVENOUS | Status: AC | PRN
  Administered 2022-05-20: 10 mg via INTRAVENOUS

## 2022-05-20 MED ORDER — OXYCODONE HCL 5 MG/5ML PO SOLN: 5.0000 mg | Freq: Once | ORAL | Status: DC | PRN

## 2022-05-20 MED ORDER — ACETAMINOPHEN 10 MG/ML IV SOLN
INTRAVENOUS | Status: AC
  Filled 2022-05-20: qty 100

## 2022-05-20 MED ORDER — PROMETHAZINE HCL 25 MG/ML IJ SOLN: 6.2500 mg | INTRAMUSCULAR | Status: DC | PRN

## 2022-05-20 MED ORDER — SUGAMMADEX SODIUM 500 MG/5ML IV SOLN
INTRAVENOUS | Status: DC | PRN
  Administered 2022-05-20: 200 mg via INTRAVENOUS

## 2022-05-20 MED ORDER — OXYCODONE HCL 5 MG PO TABS: 5.0000 mg | ORAL_TABLET | Freq: Once | ORAL | Status: DC | PRN

## 2022-05-20 MED ORDER — CHLORHEXIDINE GLUCONATE 0.12 % MT SOLN
15.0000 mL | Freq: Once | OROMUCOSAL | Status: AC
  Administered 2022-05-20: 15 mL via OROMUCOSAL

## 2022-05-20 MED ORDER — IPRATROPIUM-ALBUTEROL 0.5-2.5 (3) MG/3ML IN SOLN
3.0000 mL | RESPIRATORY_TRACT | Status: DC
Start: 2022-05-20 — End: 2022-05-20

## 2022-05-20 MED ORDER — FENTANYL CITRATE (PF) 100 MCG/2ML IJ SOLN
INTRAMUSCULAR | Status: AC
  Filled 2022-05-20: qty 2

## 2022-05-20 MED ORDER — ROCURONIUM BROMIDE 100 MG/10ML IV SOLN
INTRAVENOUS | Status: DC | PRN
  Administered 2022-05-20: 60 mg via INTRAVENOUS

## 2022-05-20 MED ORDER — LIDOCAINE 2% (20 MG/ML) 5 ML SYRINGE
INTRAMUSCULAR | Status: AC
  Filled 2022-05-20: qty 5

## 2022-05-20 MED ORDER — HYDROCODONE-ACETAMINOPHEN 5-325 MG PO TABS: 1.0000 | ORAL_TABLET | Freq: Four times a day (QID) | ORAL | 0 refills | Status: AC | PRN

## 2022-05-20 MED ORDER — ORAL CARE MOUTH RINSE: 15.0000 mL | Freq: Once | OROMUCOSAL | Status: AC

## 2022-05-20 MED ORDER — ROCURONIUM BROMIDE 10 MG/ML (PF) SYRINGE
PREFILLED_SYRINGE | INTRAVENOUS | Status: AC
  Filled 2022-05-20: qty 10

## 2022-05-20 MED ORDER — AMISULPRIDE (ANTIEMETIC) 5 MG/2ML IV SOLN
INTRAVENOUS | Status: AC
  Filled 2022-05-20: qty 4

## 2022-05-20 MED ORDER — BUPIVACAINE HCL (PF) 0.5 % IJ SOLN
INTRAMUSCULAR | Status: AC
  Filled 2022-05-20: qty 30

## 2022-05-20 MED ORDER — HEMOSTATIC AGENTS (NO CHARGE) OPTIME
TOPICAL | Status: DC | PRN
  Administered 2022-05-20: 1 via TOPICAL

## 2022-05-20 MED ORDER — PROPOFOL 10 MG/ML IV BOLUS
INTRAVENOUS | Status: DC | PRN
  Administered 2022-05-20: 120 mg via INTRAVENOUS

## 2022-05-20 MED ORDER — FENTANYL CITRATE (PF) 100 MCG/2ML IJ SOLN
INTRAMUSCULAR | Status: DC | PRN
  Administered 2022-05-20: 100 ug via INTRAVENOUS
  Administered 2022-05-20: 50 ug via INTRAVENOUS

## 2022-05-20 MED ORDER — CHLORHEXIDINE GLUCONATE CLOTH 2 % EX PADS: 6.0000 | MEDICATED_PAD | Freq: Once | CUTANEOUS | Status: DC

## 2022-05-20 MED ORDER — LACTATED RINGERS IV SOLN: INTRAVENOUS | Status: DC

## 2022-05-20 MED ORDER — EPHEDRINE 5 MG/ML INJ
INTRAVENOUS | Status: AC
  Filled 2022-05-20: qty 5

## 2022-05-20 MED ORDER — BUPIVACAINE HCL (PF) 0.5 % IJ SOLN
INTRAMUSCULAR | Status: DC | PRN
  Administered 2022-05-20: 20 mL

## 2022-05-20 MED ORDER — ACETAMINOPHEN 10 MG/ML IV SOLN
1000.0000 mg | Freq: Once | INTRAVENOUS | Status: DC | PRN
  Administered 2022-05-20: 1000 mg via INTRAVENOUS

## 2022-05-20 MED ORDER — DEXAMETHASONE SODIUM PHOSPHATE 10 MG/ML IJ SOLN
INTRAMUSCULAR | Status: DC | PRN
  Administered 2022-05-20: 8 mg via INTRAVENOUS

## 2022-05-20 MED ORDER — ONDANSETRON HCL 4 MG/2ML IJ SOLN
INTRAMUSCULAR | Status: DC | PRN
  Administered 2022-05-20: 4 mg via INTRAVENOUS

## 2022-05-20 MED ORDER — ONDANSETRON HCL 4 MG/2ML IJ SOLN
INTRAMUSCULAR | Status: AC
  Filled 2022-05-20: qty 2

## 2022-05-20 SURGICAL SUPPLY — 36 items
ADH SKN CLS APL DERMABOND .7 (GAUZE/BANDAGES/DRESSINGS) ×1
APL PRP STRL LF DISP 70% ISPRP (MISCELLANEOUS) ×1
ATTRACTOMAT 16X20 MAGNETIC DRP (DRAPES) ×2 IMPLANT
BAG COUNTER SPONGE SURGICOUNT (BAG) ×2 IMPLANT
BAG SPNG CNTER NS LX DISP (BAG)
BLADE SURG 15 STRL LF DISP TIS (BLADE) ×2 IMPLANT
BLADE SURG 15 STRL SS (BLADE) ×1
CHLORAPREP W/TINT 26 (MISCELLANEOUS) ×2 IMPLANT
CLIP TI MEDIUM 6 (CLIP) ×4 IMPLANT
CLIP TI WIDE RED SMALL 6 (CLIP) ×4 IMPLANT
COVER SURGICAL LIGHT HANDLE (MISCELLANEOUS) ×2 IMPLANT
DERMABOND ADVANCED (GAUZE/BANDAGES/DRESSINGS) ×1
DERMABOND ADVANCED .7 DNX12 (GAUZE/BANDAGES/DRESSINGS) ×2 IMPLANT
DRAPE LAPAROTOMY T 98X78 PEDS (DRAPES) ×2 IMPLANT
DRAPE UTILITY XL STRL (DRAPES) ×2 IMPLANT
ELECT REM PT RETURN 15FT ADLT (MISCELLANEOUS) ×2 IMPLANT
GAUZE 4X4 16PLY ~~LOC~~+RFID DBL (SPONGE) ×2 IMPLANT
GLOVE SURG ORTHO 8.0 STRL STRW (GLOVE) ×2 IMPLANT
GOWN STRL REUS W/ TWL XL LVL3 (GOWN DISPOSABLE) ×6 IMPLANT
GOWN STRL REUS W/TWL XL LVL3 (GOWN DISPOSABLE) ×4
HEMOSTAT SURGICEL 2X4 FIBR (HEMOSTASIS) ×2 IMPLANT
ILLUMINATOR WAVEGUIDE N/F (MISCELLANEOUS) IMPLANT
KIT BASIN OR (CUSTOM PROCEDURE TRAY) ×2 IMPLANT
KIT TURNOVER KIT A (KITS) IMPLANT
NDL HYPO 25X1 1.5 SAFETY (NEEDLE) ×2 IMPLANT
NEEDLE HYPO 25X1 1.5 SAFETY (NEEDLE) ×1 IMPLANT
PACK BASIC VI WITH GOWN DISP (CUSTOM PROCEDURE TRAY) ×2 IMPLANT
PENCIL SMOKE EVACUATOR (MISCELLANEOUS) ×2 IMPLANT
SHEARS HARMONIC ACE PLUS 36CM (ENDOMECHANICALS) IMPLANT
SUT MNCRL AB 4-0 PS2 18 (SUTURE) ×2 IMPLANT
SUT VIC AB 3-0 SH 18 (SUTURE) ×2 IMPLANT
SYR BULB IRRIG 60ML STRL (SYRINGE) ×2 IMPLANT
SYR CONTROL 10ML LL (SYRINGE) ×2 IMPLANT
TOWEL OR 17X26 10 PK STRL BLUE (TOWEL DISPOSABLE) ×2 IMPLANT
TOWEL OR NON WOVEN STRL DISP B (DISPOSABLE) ×2 IMPLANT
TUBING CONNECTING 10 (TUBING) ×2 IMPLANT

## 2022-05-20 NOTE — Anesthesia Preprocedure Evaluation (Addendum)
Anesthesia Evaluation  Patient identified by MRN, date of birth, ID band Patient awake    Reviewed: Allergy & Precautions, NPO status , Patient's Chart, lab work & pertinent test results  Airway Mallampati: II  TM Distance: >3 FB Neck ROM: Full    Dental  (+) Teeth Intact, Dental Advisory Given   Pulmonary neg pulmonary ROS,    breath sounds clear to auscultation       Cardiovascular hypertension, Pt. on medications  Rhythm:Regular Rate:Normal     Neuro/Psych negative neurological ROS  negative psych ROS   GI/Hepatic Neg liver ROS, hiatal hernia, GERD  Medicated,  Endo/Other  negative endocrine ROS  Renal/GU Renal disease     Musculoskeletal negative musculoskeletal ROS (+)   Abdominal Normal abdominal exam  (+)   Peds  Hematology negative hematology ROS (+)   Anesthesia Other Findings   Reproductive/Obstetrics                           Anesthesia Physical Anesthesia Plan  ASA: 2  Anesthesia Plan: General   Post-op Pain Management:    Induction: Intravenous  PONV Risk Score and Plan: 4 or greater and Ondansetron, Dexamethasone, Midazolam and Scopolamine patch - Pre-op  Airway Management Planned: Oral ETT  Additional Equipment: None  Intra-op Plan:   Post-operative Plan: Extubation in OR  Informed Consent: I have reviewed the patients History and Physical, chart, labs and discussed the procedure including the risks, benefits and alternatives for the proposed anesthesia with the patient or authorized representative who has indicated his/her understanding and acceptance.     Dental advisory given  Plan Discussed with: CRNA  Anesthesia Plan Comments:        Anesthesia Quick Evaluation

## 2022-05-20 NOTE — Discharge Instructions (Addendum)
[facility] - Dr. Todd Gerkin  THYROID & PARATHYROID SURGERY:  POST-OP INSTRUCTIONS  Always review the instruction sheet provided by the hospital nurse at discharge.  A prescription for pain medication may be sent to your pharmacy at the time of discharge.  Take your pain medication as prescribed.  If narcotic pain medicine is not needed, then you may take acetaminophen (Tylenol) or ibuprofen (Advil) as needed for pain or soreness.  Take your normal home medications as prescribed unless otherwise directed.  If you need a refill on your pain medication, please contact the office during regular business hours.  Prescriptions will not be processed by the office after 5:00PM or on weekends.  Start with a light diet upon arrival home, such as soup and crackers or toast.  Be sure to drink plenty of fluids.  Resume your normal diet the day after surgery.  Most patients will experience some swelling and bruising on the chest and neck area.  Ice packs will help for the first 48 hours after arriving home.  Swelling and bruising will take several days to resolve.   It is common to experience some constipation after surgery.  Increasing fluid intake and taking a stool softener (Colace) will usually help to prevent this problem.  A mild laxative (Milk of Magnesia or Miralax) should be taken according to package directions if there has been no bowel movement after 48 hours.  Dermabond glue covers your incision. This seals the wound and you may shower at any time. The Dermabond will remain in place for about a week.  You may gradually remove the glue when it loosens around the edges.  If you need to loosen the Dermabond for removal, apply a layer of Vaseline to the wound for 15 minutes and then remove with a Kleenex. Your sutures are under the skin and will not show - they will dissolve on their own.  You may resume light daily activities beginning the day after discharge (such as self-care,  walking, climbing stairs), gradually increasing activities as tolerated. You may have sexual intercourse when it is comfortable. Refrain from any heavy lifting or straining until approved by your doctor. You may drive when you no longer are taking prescription pain medication, you can comfortably wear a seatbelt, and you can safely maneuver your car and apply the brakes.  You will see your doctor in the office for a follow-up appointment approximately three weeks after your surgery.  Make sure that you call for this appointment within a day or two after you arrive home to insure a convenient appointment time. Please have any requested laboratory tests performed a few days prior to your office visit so that the results will be available at your follow up appointment.  WHEN TO CALL THE [facility] OFFICE: -- Fever greater than 101.5 -- Inability to urinate -- Nausea and/or vomiting - persistent -- Extreme swelling or bruising -- Continued bleeding from incision -- Increased pain, redness, or drainage from the incision -- Difficulty swallowing or breathing -- Muscle cramping or spasms -- Numbness or tingling in hands or around lips  The clinic staff is available to answer your questions during regular business hours.  Please don't hesitate to call and ask to speak to one of the nurses if you have concerns.  [facility] OFFICE: [phone] (24 hours)  Please sign up for MyChart accounts. This will allow you to communicate directly with my nurse or myself without having to call the office. It will also allow you   to view your test results. You will need to enroll in MyChart for my office ([facility]) and for the hospital ([place]).  Todd Gerkin, MD [facility] practice 

## 2022-05-20 NOTE — Transfer of Care (Signed)
Immediate Anesthesia Transfer of Care Note  Patient: Cynthia Dyer  Procedure(s) Performed: NECK EXPLORATION WITH PARATHYROIDECTOMY (Abdomen)  Patient Location: PACU  Anesthesia Type:General  Level of Consciousness: awake, oriented and drowsy  Airway & Oxygen Therapy: Patient Spontanous Breathing and Patient connected to face mask oxygen  Post-op Assessment: Report given to RN, Post -op Vital signs reviewed and stable and Patient moving all extremities X 4  Post vital signs: Reviewed and stable  Last Vitals:  Vitals Value Taken Time  BP 101/76 05/20/22 1515  Temp    Pulse 77 05/20/22 1517  Resp 19 05/20/22 1517  SpO2 98 % 05/20/22 1517  Vitals shown include unvalidated device data.  Last Pain:  Vitals:   05/20/22 1137  TempSrc:   PainSc: 0-No pain         Complications: No notable events documented.

## 2022-05-20 NOTE — Anesthesia Postprocedure Evaluation (Signed)
Anesthesia Post Note  Patient: Cynthia Dyer  Procedure(s) Performed: NECK EXPLORATION WITH PARATHYROIDECTOMY (Abdomen)     Patient location during evaluation: PACU Anesthesia Type: General Level of consciousness: awake and alert Pain management: pain level controlled Vital Signs Assessment: post-procedure vital signs reviewed and stable Respiratory status: spontaneous breathing, nonlabored ventilation, respiratory function stable and patient connected to nasal cannula oxygen Cardiovascular status: blood pressure returned to baseline and stable Postop Assessment: no apparent nausea or vomiting Anesthetic complications: no   No notable events documented.  Last Vitals:  Vitals:   05/20/22 1600 05/20/22 1615  BP: (!) 146/84 (!) 143/73  Pulse: 65 76  Resp: 12 20  Temp:  36.5 C  SpO2: 92% 93%    Last Pain:  Vitals:   05/20/22 1615  TempSrc:   PainSc: [place] Cynthia Dyer

## 2022-05-20 NOTE — Op Note (Signed)
Operative Note  Pre-operative Diagnosis:  primary hyperparathyroidism  Post-operative Diagnosis:  same  Surgeon:  Armandina Gemma, MD  Assistant:  none   Procedure:  neck exploration with left superior parathyroidectomy, resection of left thyroid remnant  Anesthesia:  general  Estimated Blood Loss:  25 cc  Drains: none         Specimen: parathyroid and thyroid remnant to pathology  Indications:  Patient is referred by Ammie Dalton, PA, at [place] [facility]. Patient had previously been seen by Dr. Renato Shin. Patient is referred for surgical evaluation and management of primary hyperparathyroidism. Patient had been noted to have elevated serum calcium levels for the past 4 to 5 years. She has developed complications including osteoporosis, nephrolithiasis, and chronic fatigue. Recent laboratory studies show an elevated calcium level of 12.2 and an elevated intact PTH level of 113. Recent vitamin D level was normal at the low range of normal. Patient has not had any recent imaging studies performed. She does have a history of neck surgery having undergone a left thyroid lobectomy in 1984 by Dr. Lennie Hummer. This was for benign disease. 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  Nuclear medicine parathyroid scan demonstrated activity in the mid left neck as well as activity around the inferior pole of the right thyroid lobe.  Due to the presence of multiple thyroid nodules.  This was not felt to be reliable for localization of parathyroid adenoma.  Patient now comes to surgery for neck exploration and parathyroidectomy.  Procedure:  The patient was seen in the pre-op holding area. The risks, benefits, complications, treatment options, and expected outcomes were previously discussed with the patient. The patient agreed with the proposed plan and has signed the  informed consent form.  The patient was brought to the operating room by the surgical team, identified as Cynthia Dyer and the procedure verified. A "time out" was completed and the above information confirmed.  Following administration of general endotracheal anesthesia, the patient is positioned and then prepped and draped in the usual aseptic fashion.  After ascertaining that an adequate level of anesthesia been achieved, the patient's previous collar incision is reopened with a #15 blade.  Dissection was carried through subcutaneous tissues.  Skin flaps are elevated cephalad and caudad and a self-retaining retractors placed for exposure.  Strap muscles are incised in the midline.  Dissection is carried down to the thyroid isthmus.  Strap muscles are reflected towards the left exposing the thyroid isthmus and the underlying trachea.  After further exposure, it is evident that the ultrasound examination demonstrating the presence of a left thyroid lobe is an error.  The ultrasound had identified a large nodule within the thyroid isthmus.  The left thyroid lobe appears to be nearly completely surgically absent.  Trachea is identified.  Strap muscles are freed from the anterior surface of the trachea and reflected laterally.  On palpation there is a mass in the mid left neck.  Strap muscles are mobilized and elevated allowing for visualization of what appears to be a small approximately 1 cm calcified nodule and an associated approximately 3 cm in diameter thyroid remnant.  This is gently dissected away from the strap muscles taking care to avoid medial and posterior structures, particularly being cautious for any presence of the left recurrent laryngeal nerve.  Upon mobilization of the thyroid remnant, there appears  to be an enlarged parathyroid gland superiorly.  This is also mobilized gently.  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  Thyroid tissue was dissected out circumferentially and the  harmonic scalpel was used to divide tissue in hopes of avoiding injury to any surrounding structures.  The entire thyroid remnant is excised.  This includes the calcified portion.  It will be submitted to pathology for permanent evaluation.  Superiorly there is an abnormally enlarged parathyroid gland with adherent adipose tissue.  It is completely resected using the harmonic scalpel.  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  Neck is irrigated with warm saline.  Good hemostasis is noted.  Fibrillar is placed throughout the operative field.  Strap muscles are reapproximated in the midline of interrupted 3-0 Vicryl sutures.  Platysma is closed with interrupted 3-0 Vicryl sutures.  Skin is closed with a running 4-0 Monocryl subcuticular suture.  Wound is washed and dried and Dermabond is placed as dressing.  Patient is awakened from anesthesia and transported to the recovery room.  The patient tolerated the procedure well.   Armandina Gemma, [place] [facility] Office: [phone]

## 2022-05-20 NOTE — Interval H&P Note (Signed)
History and Physical Interval Note:  05/20/2022 1:16 PM  Cynthia Dyer  has presented today for surgery, with the diagnosis of PRIMARY HYPERPARATHYROIDISM.  The various methods of treatment have been discussed with the patient and family. After consideration of risks, benefits and other options for treatment, the patient has consented to    Procedure(s): NECK EXPLORATION WITH PARATHYROIDECTOMY (N/A) as a surgical intervention.    The patient's history has been reviewed, patient examined, no change in status, stable for surgery.  I have reviewed the patient's chart and labs.  Questions were answered to the patient's satisfaction.    Armandina Gemma, [place] [facility] [place] [facility] Office: [place]

## 2022-05-20 NOTE — Anesthesia Procedure Notes (Signed)
Procedure Name: Intubation Date/Time: 05/20/2022 1:46 PM  Performed by: Jonna Munro, CRNAPre-anesthesia Checklist: Patient identified, Emergency Drugs available, Suction available, Timeout performed and Patient being monitored Patient Re-evaluated:Patient Re-evaluated prior to induction Oxygen Delivery Method: Circle system utilized Preoxygenation: Pre-oxygenation with 100% oxygen Induction Type: IV induction Ventilation: Mask ventilation without difficulty Laryngoscope Size: Mac and 3 Grade View: Grade I Tube type: Oral Tube size: 7.0 mm Number of attempts: 1 Airway Equipment and Method: Stylet Placement Confirmation: ETT inserted through vocal cords under direct vision, positive ETCO2, CO2 detector and breath sounds checked- equal and bilateral Secured at: 21 cm Tube secured with: Tape Dental Injury: Teeth and Oropharynx as per pre-operative assessment

## 2022-05-21 ENCOUNTER — Encounter (HOSPITAL_COMMUNITY): Payer: Self-pay | Admitting: Surgery

## 2022-05-21 LAB — SURGICAL PATHOLOGY

## 2022-05-22 NOTE — Progress Notes (Signed)
Good news.  It looks like we found the adenoma and removed it.  We'll check calcium and PTH levels in a few weeks.  The residual thyroid tissue was benign.  [place], MD [facility] [place] [facility] Office: [phone]

## 2022-06-12 DIAGNOSIS — E892 Postprocedural hypoparathyroidism: Secondary | ICD-10-CM | POA: Diagnosis not present

## 2022-06-12 DIAGNOSIS — E210 Primary hyperparathyroidism: Secondary | ICD-10-CM | POA: Diagnosis not present

## 2022-10-24 DIAGNOSIS — D500 Iron deficiency anemia secondary to blood loss (chronic): Secondary | ICD-10-CM | POA: Diagnosis not present

## 2022-10-24 DIAGNOSIS — E890 Postprocedural hypothyroidism: Secondary | ICD-10-CM | POA: Diagnosis not present

## 2022-10-24 DIAGNOSIS — E1165 Type 2 diabetes mellitus with hyperglycemia: Secondary | ICD-10-CM | POA: Diagnosis not present

## 2022-10-24 DIAGNOSIS — I10 Essential (primary) hypertension: Secondary | ICD-10-CM | POA: Diagnosis not present

## 2022-10-24 DIAGNOSIS — E1169 Type 2 diabetes mellitus with other specified complication: Secondary | ICD-10-CM | POA: Diagnosis not present

## 2022-11-01 DIAGNOSIS — Z1231 Encounter for screening mammogram for malignant neoplasm of breast: Secondary | ICD-10-CM | POA: Diagnosis not present

## 2022-11-28 DIAGNOSIS — E210 Primary hyperparathyroidism: Secondary | ICD-10-CM | POA: Diagnosis not present

## 2022-11-28 DIAGNOSIS — E1165 Type 2 diabetes mellitus with hyperglycemia: Secondary | ICD-10-CM | POA: Diagnosis not present

## 2022-11-28 DIAGNOSIS — I10 Essential (primary) hypertension: Secondary | ICD-10-CM | POA: Diagnosis not present

## 2023-03-07 ENCOUNTER — Other Ambulatory Visit: Payer: Self-pay | Admitting: Surgery

## 2023-03-07 DIAGNOSIS — E041 Nontoxic single thyroid nodule: Secondary | ICD-10-CM

## 2023-03-14 ENCOUNTER — Ambulatory Visit
Admission: RE | Admit: 2023-03-14 | Discharge: 2023-03-14 | Disposition: A | Discharge status: Home / Self Care | Payer: Medicare Other | Source: Ambulatory Visit | Attending: Surgery | Admitting: Surgery

## 2023-03-14 DIAGNOSIS — E049 Nontoxic goiter, unspecified: Secondary | ICD-10-CM | POA: Diagnosis not present

## 2023-03-14 DIAGNOSIS — E041 Nontoxic single thyroid nodule: Secondary | ICD-10-CM

## 2023-03-24 DIAGNOSIS — E041 Nontoxic single thyroid nodule: Secondary | ICD-10-CM | POA: Diagnosis not present

## 2023-04-22 DIAGNOSIS — E210 Primary hyperparathyroidism: Secondary | ICD-10-CM | POA: Diagnosis not present

## 2023-04-22 DIAGNOSIS — E1165 Type 2 diabetes mellitus with hyperglycemia: Secondary | ICD-10-CM | POA: Diagnosis not present

## 2023-04-22 DIAGNOSIS — I10 Essential (primary) hypertension: Secondary | ICD-10-CM | POA: Diagnosis not present

## 2023-04-22 DIAGNOSIS — E890 Postprocedural hypothyroidism: Secondary | ICD-10-CM | POA: Diagnosis not present

## 2023-04-22 DIAGNOSIS — M810 Age-related osteoporosis without current pathological fracture: Secondary | ICD-10-CM | POA: Diagnosis not present

## 2023-04-22 DIAGNOSIS — Z0000 Encounter for general adult medical examination without abnormal findings: Secondary | ICD-10-CM | POA: Diagnosis not present

## 2023-04-29 DIAGNOSIS — E042 Nontoxic multinodular goiter: Secondary | ICD-10-CM | POA: Diagnosis not present

## 2023-06-09 DIAGNOSIS — N201 Calculus of ureter: Secondary | ICD-10-CM | POA: Diagnosis not present

## 2023-06-20 DIAGNOSIS — K5730 Diverticulosis of large intestine without perforation or abscess without bleeding: Secondary | ICD-10-CM | POA: Diagnosis not present

## 2023-06-20 DIAGNOSIS — Z86010 Personal history of colonic polyps: Secondary | ICD-10-CM | POA: Diagnosis not present

## 2023-06-20 DIAGNOSIS — Z09 Encounter for follow-up examination after completed treatment for conditions other than malignant neoplasm: Secondary | ICD-10-CM | POA: Diagnosis not present

## 2023-10-09 DIAGNOSIS — I10 Essential (primary) hypertension: Secondary | ICD-10-CM | POA: Diagnosis not present

## 2023-10-09 DIAGNOSIS — E1165 Type 2 diabetes mellitus with hyperglycemia: Secondary | ICD-10-CM | POA: Diagnosis not present

## 2023-11-07 DIAGNOSIS — Z1231 Encounter for screening mammogram for malignant neoplasm of breast: Secondary | ICD-10-CM | POA: Diagnosis not present

## 2024-03-15 ENCOUNTER — Other Ambulatory Visit: Payer: Self-pay | Admitting: Surgery

## 2024-03-15 DIAGNOSIS — E042 Nontoxic multinodular goiter: Secondary | ICD-10-CM

## 2024-03-18 ENCOUNTER — Ambulatory Visit
Admission: RE | Admit: 2024-03-18 | Discharge: 2024-03-18 | Discharge status: Home / Self Care | Source: Ambulatory Visit | Attending: Surgery | Admitting: Surgery

## 2024-03-18 DIAGNOSIS — E042 Nontoxic multinodular goiter: Secondary | ICD-10-CM | POA: Diagnosis not present

## 2024-04-22 DIAGNOSIS — E042 Nontoxic multinodular goiter: Secondary | ICD-10-CM | POA: Diagnosis not present

## 2024-04-28 DIAGNOSIS — I10 Essential (primary) hypertension: Secondary | ICD-10-CM | POA: Diagnosis not present

## 2024-04-28 DIAGNOSIS — E1165 Type 2 diabetes mellitus with hyperglycemia: Secondary | ICD-10-CM | POA: Diagnosis not present

## 2024-04-28 DIAGNOSIS — Z0000 Encounter for general adult medical examination without abnormal findings: Secondary | ICD-10-CM | POA: Diagnosis not present

## 2024-04-28 DIAGNOSIS — E782 Mixed hyperlipidemia: Secondary | ICD-10-CM | POA: Diagnosis not present

## 2024-04-28 DIAGNOSIS — E890 Postprocedural hypothyroidism: Secondary | ICD-10-CM | POA: Diagnosis not present

## 2024-04-28 DIAGNOSIS — M810 Age-related osteoporosis without current pathological fracture: Secondary | ICD-10-CM | POA: Diagnosis not present

## 2024-04-29 DIAGNOSIS — E042 Nontoxic multinodular goiter: Secondary | ICD-10-CM | POA: Diagnosis not present

## 2024-05-06 ENCOUNTER — Encounter (HOSPITAL_BASED_OUTPATIENT_CLINIC_OR_DEPARTMENT_OTHER): Payer: Self-pay | Admitting: Family Medicine

## 2024-05-06 ENCOUNTER — Other Ambulatory Visit (HOSPITAL_BASED_OUTPATIENT_CLINIC_OR_DEPARTMENT_OTHER): Payer: Self-pay | Admitting: Family Medicine

## 2024-05-06 DIAGNOSIS — M810 Age-related osteoporosis without current pathological fracture: Secondary | ICD-10-CM

## 2024-07-08 ENCOUNTER — Ambulatory Visit (HOSPITAL_BASED_OUTPATIENT_CLINIC_OR_DEPARTMENT_OTHER)
Admission: RE | Admit: 2024-07-08 | Discharge: 2024-07-08 | Disposition: A | Discharge status: Home / Self Care | Source: Ambulatory Visit | Attending: Family Medicine | Admitting: Family Medicine

## 2024-07-08 DIAGNOSIS — M810 Age-related osteoporosis without current pathological fracture: Secondary | ICD-10-CM | POA: Insufficient documentation

## 2024-07-08 DIAGNOSIS — M8589 Other specified disorders of bone density and structure, multiple sites: Secondary | ICD-10-CM | POA: Diagnosis not present

## 2024-07-08 DIAGNOSIS — Z780 Asymptomatic menopausal state: Secondary | ICD-10-CM | POA: Diagnosis not present
